# Patient Record
Sex: Female | Born: 1945
Health system: Southern US, Community
[De-identification: ages and names within clinical notes are randomized; demographics above are authoritative.]

---

## 2019-04-18 ENCOUNTER — Inpatient Hospital Stay (HOSPITAL_COMMUNITY): Payer: Medicare Other

## 2019-04-18 ENCOUNTER — Inpatient Hospital Stay (HOSPITAL_COMMUNITY)
Admission: AD | Admit: 2019-04-18 | Discharge: 2019-05-06 | DRG: 870 | Disposition: A | Discharge status: Hospice | Payer: Medicare Other | Source: Other Acute Inpatient Hospital | Attending: Internal Medicine | Admitting: Internal Medicine

## 2019-04-18 DIAGNOSIS — Z7189 Other specified counseling: Secondary | ICD-10-CM | POA: Diagnosis not present

## 2019-04-18 DIAGNOSIS — G9340 Encephalopathy, unspecified: Secondary | ICD-10-CM | POA: Diagnosis not present

## 2019-04-18 DIAGNOSIS — A4151 Sepsis due to Escherichia coli [E. coli]: Secondary | ICD-10-CM | POA: Diagnosis present

## 2019-04-18 DIAGNOSIS — R0689 Other abnormalities of breathing: Secondary | ICD-10-CM | POA: Diagnosis not present

## 2019-04-18 DIAGNOSIS — N184 Chronic kidney disease, stage 4 (severe): Secondary | ICD-10-CM | POA: Diagnosis present

## 2019-04-18 DIAGNOSIS — Z7984 Long term (current) use of oral hypoglycemic drugs: Secondary | ICD-10-CM

## 2019-04-18 DIAGNOSIS — Z978 Presence of other specified devices: Secondary | ICD-10-CM | POA: Diagnosis not present

## 2019-04-18 DIAGNOSIS — G92 Toxic encephalopathy: Secondary | ICD-10-CM | POA: Diagnosis present

## 2019-04-18 DIAGNOSIS — E785 Hyperlipidemia, unspecified: Secondary | ICD-10-CM | POA: Diagnosis present

## 2019-04-18 DIAGNOSIS — Z87442 Personal history of urinary calculi: Secondary | ICD-10-CM

## 2019-04-18 DIAGNOSIS — E1122 Type 2 diabetes mellitus with diabetic chronic kidney disease: Secondary | ICD-10-CM | POA: Diagnosis present

## 2019-04-18 DIAGNOSIS — A419 Sepsis, unspecified organism: Secondary | ICD-10-CM

## 2019-04-18 DIAGNOSIS — N179 Acute kidney failure, unspecified: Secondary | ICD-10-CM | POA: Diagnosis present

## 2019-04-18 DIAGNOSIS — F0390 Unspecified dementia without behavioral disturbance: Secondary | ICD-10-CM | POA: Diagnosis present

## 2019-04-18 DIAGNOSIS — E870 Hyperosmolality and hypernatremia: Secondary | ICD-10-CM | POA: Diagnosis present

## 2019-04-18 DIAGNOSIS — Z6834 Body mass index (BMI) 34.0-34.9, adult: Secondary | ICD-10-CM

## 2019-04-18 DIAGNOSIS — R918 Other nonspecific abnormal finding of lung field: Secondary | ICD-10-CM | POA: Diagnosis not present

## 2019-04-18 DIAGNOSIS — Z1621 Resistance to vancomycin: Secondary | ICD-10-CM | POA: Diagnosis present

## 2019-04-18 DIAGNOSIS — E039 Hypothyroidism, unspecified: Secondary | ICD-10-CM | POA: Diagnosis present

## 2019-04-18 DIAGNOSIS — J9601 Acute respiratory failure with hypoxia: Secondary | ICD-10-CM | POA: Diagnosis present

## 2019-04-18 DIAGNOSIS — F419 Anxiety disorder, unspecified: Secondary | ICD-10-CM | POA: Diagnosis present

## 2019-04-18 DIAGNOSIS — Z515 Encounter for palliative care: Secondary | ICD-10-CM

## 2019-04-18 DIAGNOSIS — Z01818 Encounter for other preprocedural examination: Secondary | ICD-10-CM | POA: Diagnosis not present

## 2019-04-18 DIAGNOSIS — E11649 Type 2 diabetes mellitus with hypoglycemia without coma: Secondary | ICD-10-CM | POA: Diagnosis present

## 2019-04-18 DIAGNOSIS — Z781 Physical restraint status: Secondary | ICD-10-CM

## 2019-04-18 DIAGNOSIS — N390 Urinary tract infection, site not specified: Secondary | ICD-10-CM | POA: Diagnosis present

## 2019-04-18 DIAGNOSIS — M109 Gout, unspecified: Secondary | ICD-10-CM | POA: Diagnosis present

## 2019-04-18 DIAGNOSIS — K219 Gastro-esophageal reflux disease without esophagitis: Secondary | ICD-10-CM | POA: Diagnosis present

## 2019-04-18 DIAGNOSIS — E1140 Type 2 diabetes mellitus with diabetic neuropathy, unspecified: Secondary | ICD-10-CM | POA: Diagnosis present

## 2019-04-18 DIAGNOSIS — D631 Anemia in chronic kidney disease: Secondary | ICD-10-CM | POA: Diagnosis present

## 2019-04-18 DIAGNOSIS — M1990 Unspecified osteoarthritis, unspecified site: Secondary | ICD-10-CM | POA: Diagnosis present

## 2019-04-18 DIAGNOSIS — I5032 Chronic diastolic (congestive) heart failure: Secondary | ICD-10-CM | POA: Diagnosis present

## 2019-04-18 DIAGNOSIS — Z8673 Personal history of transient ischemic attack (TIA), and cerebral infarction without residual deficits: Secondary | ICD-10-CM

## 2019-04-18 DIAGNOSIS — I130 Hypertensive heart and chronic kidney disease with heart failure and stage 1 through stage 4 chronic kidney disease, or unspecified chronic kidney disease: Secondary | ICD-10-CM | POA: Diagnosis present

## 2019-04-18 DIAGNOSIS — B9620 Unspecified Escherichia coli [E. coli] as the cause of diseases classified elsewhere: Secondary | ICD-10-CM | POA: Diagnosis not present

## 2019-04-18 DIAGNOSIS — E7800 Pure hypercholesterolemia, unspecified: Secondary | ICD-10-CM | POA: Diagnosis present

## 2019-04-18 DIAGNOSIS — E1151 Type 2 diabetes mellitus with diabetic peripheral angiopathy without gangrene: Secondary | ICD-10-CM | POA: Diagnosis present

## 2019-04-18 DIAGNOSIS — R6521 Severe sepsis with septic shock: Secondary | ICD-10-CM | POA: Diagnosis present

## 2019-04-18 DIAGNOSIS — Z66 Do not resuscitate: Secondary | ICD-10-CM | POA: Diagnosis present

## 2019-04-18 DIAGNOSIS — J9600 Acute respiratory failure, unspecified whether with hypoxia or hypercapnia: Secondary | ICD-10-CM

## 2019-04-18 DIAGNOSIS — J9690 Respiratory failure, unspecified, unspecified whether with hypoxia or hypercapnia: Secondary | ICD-10-CM

## 2019-04-18 DIAGNOSIS — N170 Acute kidney failure with tubular necrosis: Secondary | ICD-10-CM | POA: Diagnosis not present

## 2019-04-18 DIAGNOSIS — Z9289 Personal history of other medical treatment: Secondary | ICD-10-CM

## 2019-04-18 DIAGNOSIS — Z20828 Contact with and (suspected) exposure to other viral communicable diseases: Secondary | ICD-10-CM | POA: Diagnosis present

## 2019-04-18 DIAGNOSIS — R310 Gross hematuria: Secondary | ICD-10-CM | POA: Diagnosis not present

## 2019-04-18 DIAGNOSIS — D696 Thrombocytopenia, unspecified: Secondary | ICD-10-CM | POA: Diagnosis present

## 2019-04-18 DIAGNOSIS — B9629 Other Escherichia coli [E. coli] as the cause of diseases classified elsewhere: Secondary | ICD-10-CM | POA: Diagnosis not present

## 2019-04-18 DIAGNOSIS — Z1612 Extended spectrum beta lactamase (ESBL) resistance: Secondary | ICD-10-CM | POA: Diagnosis present

## 2019-04-18 DIAGNOSIS — E1165 Type 2 diabetes mellitus with hyperglycemia: Secondary | ICD-10-CM | POA: Diagnosis not present

## 2019-04-18 DIAGNOSIS — Z789 Other specified health status: Secondary | ICD-10-CM

## 2019-04-18 DIAGNOSIS — R7881 Bacteremia: Secondary | ICD-10-CM | POA: Diagnosis not present

## 2019-04-18 DIAGNOSIS — E669 Obesity, unspecified: Secondary | ICD-10-CM | POA: Diagnosis present

## 2019-04-18 DIAGNOSIS — Z993 Dependence on wheelchair: Secondary | ICD-10-CM

## 2019-04-18 DIAGNOSIS — R52 Pain, unspecified: Secondary | ICD-10-CM

## 2019-04-18 DIAGNOSIS — E875 Hyperkalemia: Secondary | ICD-10-CM | POA: Diagnosis not present

## 2019-04-18 DIAGNOSIS — T829XXA Unspecified complication of cardiac and vascular prosthetic device, implant and graft, initial encounter: Secondary | ICD-10-CM

## 2019-04-18 LAB — GLUCOSE, CAPILLARY
Glucose-Capillary: 120 mg/dL — ABNORMAL HIGH (ref 70–99)
Glucose-Capillary: 66 mg/dL — ABNORMAL LOW (ref 70–99)

## 2019-04-18 LAB — MRSA PCR SCREENING: MRSA by PCR: POSITIVE — AB

## 2019-04-18 MED ORDER — DEXTROSE 50 % IV SOLN
12.5000 g | INTRAVENOUS | Status: AC
  Administered 2019-04-18: 21:00:00 12.5 g via INTRAVENOUS

## 2019-04-18 MED ORDER — DEXTROSE 50 % IV SOLN
INTRAVENOUS | Status: AC
  Administered 2019-04-18: 12.5 g via INTRAVENOUS
  Filled 2019-04-18: qty 50

## 2019-04-18 MED ORDER — SODIUM CHLORIDE 0.9 % IV BOLUS
500.0000 mL | Freq: Once | INTRAVENOUS | Status: AC
  Administered 2019-04-18: 500 mL via INTRAVENOUS

## 2019-04-18 MED ORDER — NEPRO/CARBSTEADY PO LIQD
1000.0000 mL | ORAL | Status: DC
  Administered 2019-04-19: 1000 mL
  Filled 2019-04-18: qty 1000

## 2019-04-18 MED ORDER — NOREPINEPHRINE 4 MG/250ML-% IV SOLN
0.0000 ug/min | INTRAVENOUS | Status: DC
  Filled 2019-04-18: qty 250

## 2019-04-18 MED ORDER — NOREPINEPHRINE 4 MG/250ML-% IV SOLN: 5.0000 ug/min | INTRAVENOUS | Status: DC

## 2019-04-18 MED ORDER — GABAPENTIN 300 MG/6ML PO SOLN
300.0000 mg | Freq: Three times a day (TID) | ORAL | Status: DC
  Administered 2019-04-19 (×2): 300 mg
  Filled 2019-04-18 (×3): qty 6

## 2019-04-18 MED ORDER — PANTOPRAZOLE SODIUM 40 MG IV SOLR
40.0000 mg | Freq: Every day | INTRAVENOUS | Status: DC
  Administered 2019-04-19 – 2019-04-21 (×4): 40 mg via INTRAVENOUS
  Filled 2019-04-18 (×4): qty 40

## 2019-04-18 MED ORDER — ALBUMIN HUMAN 5 % IV SOLN
25.0000 g | Freq: Once | INTRAVENOUS | Status: AC
  Administered 2019-04-18: 25 g via INTRAVENOUS
  Filled 2019-04-18: qty 250

## 2019-04-18 MED ORDER — HEPARIN SODIUM (PORCINE) 5000 UNIT/ML IJ SOLN
5000.0000 [IU] | Freq: Three times a day (TID) | INTRAMUSCULAR | Status: DC
  Administered 2019-04-19 – 2019-04-25 (×20): 5000 [IU] via SUBCUTANEOUS
  Filled 2019-04-18 (×21): qty 1

## 2019-04-18 MED ORDER — LACTATED RINGERS IV BOLUS: 2000.0000 mL | Freq: Once | INTRAVENOUS | Status: DC

## 2019-04-18 MED ORDER — SODIUM CHLORIDE 0.9 % IV SOLN
250.0000 mL | INTRAVENOUS | Status: DC
  Administered 2019-04-19: 02:00:00 250 mL via INTRAVENOUS

## 2019-04-18 MED ORDER — CHLORHEXIDINE GLUCONATE CLOTH 2 % EX PADS
6.0000 | MEDICATED_PAD | Freq: Every day | CUTANEOUS | Status: DC
  Administered 2019-04-18 – 2019-05-01 (×13): 6 via TOPICAL

## 2019-04-18 MED ORDER — GABAPENTIN 600 MG PO TABS
300.0000 mg | ORAL_TABLET | Freq: Three times a day (TID) | ORAL | Status: DC
  Filled 2019-04-18: qty 0.5

## 2019-04-18 NOTE — H&P (Addendum)
..   NAME:  Nancy Mullins, MRN:  [id number], DOB:  03/12/1946, LOS: 0 ADMISSION DATE:  04/18/2019, CONSULTATION DATE:  04/18/2019 REFERRING MD:  Eugene Garnet MD, CHIEF COMPLAINT:  AMS   Brief History   72 yo F w/ PMHX NIDDM, Hypothyroid, CKD, CHF, GERD & HDL presents from [place]. In Septic Shock intubated.   History of present illness   ( History obtained from chart from [facility]. Pt encephalopathic and intubated. No other documentation in EMR)  73 yr old female presented on 7/14 from [facility] 2[facility]3 [facility]) where she had a sudden onset of altered mental status that began in the AM.  Per EMS the pt's baseline is AAO x 4. This morning she was more lethargic and somnolent. On initial assessment at [facility] ED the pt was alert and oriented only to self but lethargic. She was protecting her airway and was in no respiratory distress at that time moving all 4 extremities. She was unable to follow commands. Per documentation pt started to decline post arrival and was intubated for airway protection.  After intubation at 16:33 she became hypotensive  PCCM at [facility] accepted pt transfer.at 17:24  Per Daughter : she called to check on her Mom in the morning 7/14. She was told by the NH staff that Mom was lethargic with decreased responsiveness. She states that the patient has had similar episodes like this in the past, always with hypotension and hypoglycemia. It takes her several days to return to baseline . This is the first time she has required intubation. Her baseline per her daughter is  Alert and oriented to person and place. She does have a h/o dementia.   Past Medical History  Per chart from [place]  HTN HLD ARTHRITIS GERD GOUT DEMENTIA NEPHROLITHIASIS  CKD CHF NIDDM w/ Neuropathy HYPERKALEMIA HYPOTHYROID PVD NEPHROLITHIASIS   Patient had a urine cx from 6/11 that on 6/13 resulted as Ecoli/ESBL  .>>verified by [facility]   Transferred from [facility] via [place]:  None currently  Procedures:  7/14 1633>>>>Endotracheally intubated at [place] Diagnostic Tests:  ABG: 7.29/45.4/95.9/21.5 WBC 7.88/ Hgb 7.8 Hct 25.7 MCV 91 Plts 236 LACTIC ACID 1 PT 18.7 INR 1.7 Trop <0.015  UA: Yellow turbid urine Spec grav 1.011 ph 5.5 protein 1+ Blood +3 Large Leukocytes negative Nitrites WBC>50 RBC >50 Bacteria 3+ UDS: negative 7/14 15:45>>>SARSCOV2 NEGATIVE POC GLUCOSE 62  Acetaminophen <1 Salicylate <2 Na 924, K+ 3.8 Cl- 115 HCO3- 23 AG 8 Gluc 113 Calcium 8.1 Albumin 2.2 Tot Protein 5.8 Bilirubin Tot 0.2 BUN 31 Cr 2.98 GFR 19 AST 12 ALT 15 ALK PHOS 81 CKMB 5.2 TSH 1.79 Ammonia 18 Valproic Acid level 77   CTH w/o contrast at [facility]: no acute intracranial process Chronic involution changes ( disc on chart)  CXR at [facility]: hypoinflated lungs w/ vascular congestion/edema. ( disc on chart)   Micro Data:  04/18/2019>>>>Blood cx x 2 04/18/2019>>>>Urine cx  Antimicrobials:  04/18/2019>>>Meropenem   04/18/2019>>> Ceftriaxone   Interim history/subjective:  Initial Vitals on presentation: 132/68mHg HR 70 O2 sat 100% Became hypotensive 2 hrs post arrival  [facility] of D50 with Meropenem and Ceftriaxone   Objective   Blood pressure (!) 61/50, pulse 73, temperature (!) 95.2 F (35.1 C), temperature source Bladder, resp. rate 14, weight 80 kg, SpO2 100 %.    Vent Mode: PRVC  FiO2 (%):  [30 %] 30 % Set Rate:  [14 bmp] 14 bmp Vt Set:  [350 mL] 350 mL PEEP:  [5 [place] Pressure:  [14 cmH20] 14 cmH20   Intake/Output Summary (Last 24 hours) at 04/18/2019 2221 Last data filed at 04/18/2019 2100 Gross per 24 hour  Intake -  Output 150 ml  Net -150 ml   Filed Weights   04/18/19 2100  Weight: 80 kg    Examination: General: somnolent intubated HENT: normocephalic ETT in  oropharynx Lungs: coarse bilaterally w/ crackles at bases Cardiovascular: S1 and S2 appreciated reg rate and rhythm Abdomen: soft non tender non distended + BS Extremities: no signs of atrophy.  Neuro: somnolent, withdraws from painful stimuli, coughs intermittently.  GU: temp foley placed at [facility] Skin: small tear under left breast. Heels cracked   Assessment & Plan:  1. Acute Encephalopathy Progressive mental decline starting AM 4/53 No clear metabolic or toxic etiology May be secondary to sepsis from UTI Pt was never hypoxic per chart review CTH w/o contrast was negative ? H/o seizures not able to verify but pt had a Valproic acid level 77 within therapeutic range [facility] >>sending records Plan: Hold all sedative medications Continue on Neuro checks If pt does not improve may warrant EEG  and further workup by Neuro  2. Acute Respiratory Insufficiency Secondary to patient's declining mental status concern for protection of airway Endotracheally intubated at [place]: TV 8 cc/kg  Titrate FiO2 to keep O2 sat >92% VA Precautions Continue full MV support until mental status improves SBT when pt meets parameters  3. Septic Shock vs Hypovolemic shock Source: suspected UTI>>has a h/o ESBL from cx on 03/16/2019 Has a h/o CHF No signs of bleeding Pt became hypotensive shortly after intubation Plan: CVL placement>>>CVP monitoring If <8 will fluid resuscitate ( pt received 3L NS at [facility]) MAP goal >38mHg Arterial line to be placed Will get a 2D ECHO to assess LVEF Continue on Levophed gtt titrate to MAP goal Check cortisol level>> may benefit from stress dose steroids F/u blood and urine cultures. F/u PCT Continue on contact precautions given ESBL h/o Continue with Meropenem>> renally dosed given GFR 19 Pharmacy notified of previous antibiotics>> [facility] Deescalate abx when possible  4. CKD stage IV GFR  19 [place] Avoid nephrotoxic medications Strict Is/Os  5. Anemia of Chronic disease H/o CKD and multiple co morbidities Plan: F/u Hgb If Hgb <7 transfuse PRBCs Start on folic acid F/u iron studies  6. H/o NIDDM Hypoglycemic on arrival Plan: Hold ISS at this time Starting on early TF with NEPRO due to elevated Creatinine   Best practice:  Diet: NPO Pain/Anxiety/Delirium protocol (if indicated): not at this time VAP protocol (if indicated): yes DVT prophylaxis: Hep [place] and SCDs GI prophylaxis: Protonix Glucose control: on hold due to hypoglycemia Mobility: bedrest Code Status: Full  Family Communication: Daughter's HSuezanne Cheshirephone number [phone]: ICU  Labs   CBC: No results for input(s): WBC, NEUTROABS, HGB, HCT, MCV, PLT in the last 168 hours.  Basic Metabolic Panel: No results for input(s): NA, K, CL, CO2, GLUCOSE, BUN, CREATININE, CALCIUM, MG, PHOS in the last 168 hours. GFR: CrCl cannot be calculated (No successful lab value found.). No results for input(s): PROCALCITON, WBC, LATICACIDVEN in the last 168 hours.  Liver Function Tests: No results for input(s): AST, ALT, ALKPHOS, BILITOT, PROT, ALBUMIN in the last  168 hours. No results for input(s): LIPASE, AMYLASE in the last 168 hours. No results for input(s): AMMONIA in the last 168 hours.  ABG No results found for: PHART, PCO2ART, PO2ART, HCO3, TCO2, ACIDBASEDEF, O2SAT   Coagulation Profile: No results for input(s): INR, PROTIME in the last 168 hours.  Cardiac Enzymes: No results for input(s): CKTOTAL, CKMB, CKMBINDEX, TROPONINI in the last 168 hours.  HbA1C: No results found for: HGBA1C  CBG: Recent Labs  Lab 04/18/19 2058 04/18/19 2118  GLUCAP 66* 120*    Review of Systems:   Marland KitchenMarland KitchenReview of Systems  Unable to perform ROS: Mental status change   AND INTUBATED.  Past Medical History  She,  has no past medical history on file.   Surgical  History   NONE IN EMR and NOT AVAILABLE on CHART SENT   Social History      Family History   Her family history is not on file.   Allergies Allergies  Allergen Reactions  . Benzonatate   . Codeine   . Lithium      Home Medications  Prior to Admission medications   Not on File    STAFF NOTE  I, Dr Seward Carol have personally reviewed patient's available data, including medical history, events of note, physical examination and test results as part of my evaluation. I have discussed with NP and other care providers such as pharmacist, RN and Elink.  In addition,  I personally evaluated patient  The patient is critically ill with multiple organ systems failure and requires high complexity decision making for assessment and support, frequent evaluation and titration of therapies, application of advanced monitoring technologies and extensive interpretation of multiple databases.   Critical Care Time devoted to patient care services described in this note is 4 Minutes. This time reflects time of care of this signee Dr Seward Carol. This critical care time does not reflect procedure time, or teaching time or supervisory time but could involve care discussion time    Dr. Seward Carol Pulmonary Critical Care Medicine  04/18/2019 11:26 PM   Critical care time: 55 mins

## 2019-04-18 NOTE — Procedures (Signed)
Central Venous Catheter Insertion Procedure Note Nancy Mullins [id number] Sep 16, 1946  Procedure: Insertion of Central Venous Catheter Indications: Assessment of intravascular volume, Drug and/or fluid administration and Frequent blood sampling  Procedure Details Consent: Unable to obtain consent because of altered level of consciousness. Time Out: Verified patient identification, verified procedure, site/side was marked, verified correct patient position, special equipment/implants available, medications/allergies/relevent history reviewed, required imaging and test results available.  Performed  Maximum sterile technique was used including antiseptics, cap, gloves, gown, hand hygiene, mask and sheet. Skin prep: Chlorhexidine; local anesthetic administered A antimicrobial bonded/coated triple lumen catheter was placed in the left internal jugular vein using the Seldinger technique.  Evaluation Blood flow good Complications: No apparent complications Patient did tolerate procedure well. Chest X-ray ordered to verify placement.  CXR: pending.  Procedure performed under direct ultrasound guidance for real time vessel cannulation.      Montey Hora, [place] [facility] Pager: [phone]  or [phone] 04/18/2019, 10:44 PM

## 2019-04-18 NOTE — Progress Notes (Signed)
eLink Physician-Brief Progress Note Patient Name: Nancy Mullins DOB: Apr 06, 1946 MRN: [id number]   Date of Service  04/18/2019  HPI/Events of Note  Pt just admitted to 3 M 12 from outside hospital, there are no records in the chart in [place]. Pt is hypotensive with BP of 60/51 despite receiving 3 liters of iv fluids.  eICU Interventions  I've asked the ground crew to see stat, 5 % Albumin 500 ml iv bolus x 1 stat, Normal Saline 500 ml iv bolus x 1 stat, Norepinephrine infusion ordered stat, arterial line insertion ordered stat.        Kerry Kass Vantasia Pinkney 04/18/2019, 10:02 PM

## 2019-04-18 NOTE — Progress Notes (Signed)
Placed Arterial Line, collected ABG.  Patient's pH 7.330, PaCO2 35.5, PaO2 95, HCO3 19.  The Hbg showed low on ABG at 6.5, Anderson Malta, RN and Dr. Andria Meuse were both notified.

## 2019-04-18 NOTE — Progress Notes (Signed)
Hypoglycemic Event  CBG: 66  Treatment: D50 25 mL (12.5 gm)  Symptoms: None  Follow-up CBG: Time: 2119 CBG Result: 120  Possible Reasons for Event: Unknown  Comments/MD notified: MD to be notified    Nancy Mullins, Garnette Scheuermann

## 2019-04-18 NOTE — Progress Notes (Signed)
Patient arrived to 3M12.  Placed patient on vent to the settings given by [place] crew.  They stated that when they took over, the patient was not tolerating the prior vent settings well, so they had her on Vt of 350, Rate of 14 at 40% with a PEEP of 5.  I have placed patient on these settings at this time, patient is tolerating well.  Will continue to monitor.

## 2019-04-19 ENCOUNTER — Inpatient Hospital Stay (HOSPITAL_COMMUNITY): Payer: Medicare Other

## 2019-04-19 ENCOUNTER — Other Ambulatory Visit: Payer: Self-pay

## 2019-04-19 DIAGNOSIS — A419 Sepsis, unspecified organism: Secondary | ICD-10-CM

## 2019-04-19 DIAGNOSIS — G92 Toxic encephalopathy: Secondary | ICD-10-CM

## 2019-04-19 LAB — CBC WITH DIFFERENTIAL/PLATELET
Abs Immature Granulocytes: 0.29 10*3/uL — ABNORMAL HIGH (ref 0.00–0.07)
Basophils Absolute: 0 10*3/uL (ref 0.0–0.1)
Basophils Relative: 0 %
Eosinophils Absolute: 0 10*3/uL (ref 0.0–0.5)
Eosinophils Relative: 0 %
HCT: 21.9 % — ABNORMAL LOW (ref 36.0–46.0)
Hemoglobin: 6.9 g/dL — CL (ref 12.0–15.0)
Immature Granulocytes: 4 %
Lymphocytes Relative: 10 %
Lymphs Abs: 0.7 10*3/uL (ref 0.7–4.0)
MCH: 28.3 pg (ref 26.0–34.0)
MCHC: 31.5 g/dL (ref 30.0–36.0)
MCV: 89.8 fL (ref 80.0–100.0)
Monocytes Absolute: 0.4 10*3/uL (ref 0.1–1.0)
Monocytes Relative: 6 %
Neutro Abs: 5.3 10*3/uL (ref 1.7–7.7)
Neutrophils Relative %: 80 %
Platelets: 208 10*3/uL (ref 150–400)
RBC: 2.44 MIL/uL — ABNORMAL LOW (ref 3.87–5.11)
RDW: 18.1 % — ABNORMAL HIGH (ref 11.5–15.5)
WBC: 6.6 10*3/uL (ref 4.0–10.5)
nRBC: 1.5 % — ABNORMAL HIGH (ref 0.0–0.2)

## 2019-04-19 LAB — POCT I-STAT 7, (LYTES, BLD GAS, ICA,H+H)
Acid-base deficit: 7 mmol/L — ABNORMAL HIGH (ref 0.0–2.0)
Bicarbonate: 19 mmol/L — ABNORMAL LOW (ref 20.0–28.0)
Calcium, Ion: 1.24 mmol/L (ref 1.15–1.40)
HCT: 19 % — ABNORMAL LOW (ref 36.0–46.0)
Hemoglobin: 6.5 g/dL — CL (ref 12.0–15.0)
O2 Saturation: 97 %
Patient temperature: 35.8
Potassium: 3.5 mmol/L (ref 3.5–5.1)
Sodium: 146 mmol/L — ABNORMAL HIGH (ref 135–145)
TCO2: 20 mmol/L — ABNORMAL LOW (ref 22–32)
pCO2 arterial: 35.5 mmHg (ref 32.0–48.0)
pH, Arterial: 7.33 — ABNORMAL LOW (ref 7.350–7.450)
pO2, Arterial: 95 mmHg (ref 83.0–108.0)

## 2019-04-19 LAB — COMPREHENSIVE METABOLIC PANEL
ALT: 10 U/L (ref 0–44)
AST: 10 U/L — ABNORMAL LOW (ref 15–41)
Albumin: 2 g/dL — ABNORMAL LOW (ref 3.5–5.0)
Alkaline Phosphatase: 60 U/L (ref 38–126)
Anion gap: 8 (ref 5–15)
BUN: 27 mg/dL — ABNORMAL HIGH (ref 8–23)
CO2: 20 mmol/L — ABNORMAL LOW (ref 22–32)
Calcium: 7.8 mg/dL — ABNORMAL LOW (ref 8.9–10.3)
Chloride: 118 mmol/L — ABNORMAL HIGH (ref 98–111)
Creatinine, Ser: 3.09 mg/dL — ABNORMAL HIGH (ref 0.44–1.00)
GFR calc Af Amer: 17 mL/min — ABNORMAL LOW (ref >60)
GFR calc non Af Amer: 14 mL/min — ABNORMAL LOW (ref >60)
Glucose, Bld: 76 mg/dL (ref 70–99)
Potassium: 3.9 mmol/L (ref 3.5–5.1)
Sodium: 146 mmol/L — ABNORMAL HIGH (ref 135–145)
Total Bilirubin: 0.5 mg/dL (ref 0.3–1.2)
Total Protein: 5.1 g/dL — ABNORMAL LOW (ref 6.5–8.1)

## 2019-04-19 LAB — CBC
HCT: 27.3 % — ABNORMAL LOW (ref 36.0–46.0)
Hemoglobin: 8.5 g/dL — ABNORMAL LOW (ref 12.0–15.0)
MCH: 27.8 pg (ref 26.0–34.0)
MCHC: 31.1 g/dL (ref 30.0–36.0)
MCV: 89.2 fL (ref 80.0–100.0)
Platelets: 187 10*3/uL (ref 150–400)
RBC: 3.06 MIL/uL — ABNORMAL LOW (ref 3.87–5.11)
RDW: 18 % — ABNORMAL HIGH (ref 11.5–15.5)
WBC: 7.1 10*3/uL (ref 4.0–10.5)
nRBC: 1.7 % — ABNORMAL HIGH (ref 0.0–0.2)

## 2019-04-19 LAB — TROPONIN I (HIGH SENSITIVITY)
Troponin I (High Sensitivity): 9 ng/L (ref <18)
Troponin I (High Sensitivity): 9 ng/L (ref <18)

## 2019-04-19 LAB — GLUCOSE, CAPILLARY
Glucose-Capillary: 100 mg/dL — ABNORMAL HIGH (ref 70–99)
Glucose-Capillary: 126 mg/dL — ABNORMAL HIGH (ref 70–99)
Glucose-Capillary: 70 mg/dL (ref 70–99)
Glucose-Capillary: 77 mg/dL (ref 70–99)
Glucose-Capillary: 80 mg/dL (ref 70–99)
Glucose-Capillary: 84 mg/dL (ref 70–99)
Glucose-Capillary: 86 mg/dL (ref 70–99)

## 2019-04-19 LAB — CREATININE, SERUM
Creatinine, Ser: 2.92 mg/dL — ABNORMAL HIGH (ref 0.44–1.00)
GFR calc Af Amer: 18 mL/min — ABNORMAL LOW (ref >60)
GFR calc non Af Amer: 16 mL/min — ABNORMAL LOW (ref >60)

## 2019-04-19 LAB — FOLATE: Folate: 7.1 ng/mL (ref >5.9)

## 2019-04-19 LAB — AMMONIA: Ammonia: 29 umol/L (ref 9–35)

## 2019-04-19 LAB — CK: Total CK: 18 U/L — ABNORMAL LOW (ref 38–234)

## 2019-04-19 LAB — ECHOCARDIOGRAM COMPLETE
Height: 63 in
Weight: 2821.89 oz

## 2019-04-19 LAB — MAGNESIUM: Magnesium: 1.5 mg/dL — ABNORMAL LOW (ref 1.7–2.4)

## 2019-04-19 LAB — PROTIME-INR
INR: 1.5 — ABNORMAL HIGH (ref 0.8–1.2)
Prothrombin Time: 18.3 seconds — ABNORMAL HIGH (ref 11.4–15.2)

## 2019-04-19 LAB — PROCALCITONIN
Procalcitonin: 0.63 ng/mL
Procalcitonin: 0.64 ng/mL

## 2019-04-19 LAB — TSH: TSH: 2.414 u[IU]/mL (ref 0.350–4.500)

## 2019-04-19 LAB — CORTISOL: Cortisol, Plasma: 18.4 ug/dL

## 2019-04-19 LAB — ABO/RH: ABO/RH(D): A POS

## 2019-04-19 LAB — PREPARE RBC (CROSSMATCH)

## 2019-04-19 MED ORDER — CHLORHEXIDINE GLUCONATE 0.12% ORAL RINSE (MEDLINE KIT)
15.0000 mL | Freq: Two times a day (BID) | OROMUCOSAL | Status: DC
  Administered 2019-04-19 – 2019-05-02 (×26): 15 mL via OROMUCOSAL

## 2019-04-19 MED ORDER — FOLIC ACID 1 MG PO TABS
1.0000 mg | ORAL_TABLET | Freq: Every day | ORAL | Status: AC
  Administered 2019-04-19 – 2019-04-24 (×7): 1 mg via ORAL
  Filled 2019-04-19 (×7): qty 1

## 2019-04-19 MED ORDER — GABAPENTIN 300 MG/6ML PO SOLN
300.0000 mg | Freq: Two times a day (BID) | ORAL | Status: DC
  Administered 2019-04-19 – 2019-04-24 (×10): 300 mg
  Filled 2019-04-19 (×10): qty 6

## 2019-04-19 MED ORDER — PRO-STAT SUGAR FREE PO LIQD
30.0000 mL | Freq: Every day | ORAL | Status: DC
  Administered 2019-04-19 – 2019-05-02 (×14): 30 mL
  Filled 2019-04-19 (×14): qty 30

## 2019-04-19 MED ORDER — VITAL AF 1.2 CAL PO LIQD
1000.0000 mL | ORAL | Status: DC
  Administered 2019-04-19 – 2019-05-01 (×13): 1000 mL
  Filled 2019-04-19 (×4): qty 1000

## 2019-04-19 MED ORDER — SODIUM CHLORIDE 0.9 % IV SOLN
1.0000 g | Freq: Two times a day (BID) | INTRAVENOUS | Status: DC
  Administered 2019-04-19 – 2019-04-24 (×11): 1 g via INTRAVENOUS
  Filled 2019-04-19 (×12): qty 1

## 2019-04-19 MED ORDER — MUPIROCIN 2 % EX OINT
1.0000 "application " | TOPICAL_OINTMENT | Freq: Two times a day (BID) | CUTANEOUS | Status: AC
  Administered 2019-04-19 – 2019-04-23 (×10): 1 via NASAL
  Filled 2019-04-19: qty 22

## 2019-04-19 MED ORDER — MAGNESIUM SULFATE IN D5W 1-5 GM/100ML-% IV SOLN
1.0000 g | Freq: Once | INTRAVENOUS | Status: AC
  Administered 2019-04-19: 1 g via INTRAVENOUS
  Filled 2019-04-19: qty 100

## 2019-04-19 MED ORDER — SODIUM CHLORIDE 0.9% IV SOLUTION
Freq: Once | INTRAVENOUS | Status: AC
  Administered 2019-04-19: 01:00:00 via INTRAVENOUS

## 2019-04-19 MED ORDER — LACTATED RINGERS IV BOLUS
500.0000 mL | Freq: Once | INTRAVENOUS | Status: AC
  Administered 2019-04-19: 500 mL via INTRAVENOUS

## 2019-04-19 MED ORDER — ORAL CARE MOUTH RINSE
15.0000 mL | OROMUCOSAL | Status: DC
  Administered 2019-04-19 – 2019-05-02 (×125): 15 mL via OROMUCOSAL

## 2019-04-19 NOTE — Progress Notes (Signed)
  Echocardiogram 2D Echocardiogram has been performed.  Nancy Mullins 04/19/2019, 11:53 AM

## 2019-04-19 NOTE — Progress Notes (Signed)
EEG completed, results pending. 

## 2019-04-19 NOTE — Procedures (Signed)
ELECTROENCEPHALOGRAM REPORT   Patient: Nancy Mullins       Room #: 3M12C EEG No. ID: [id number] Age: 73 y.o.        Sex: female Referring Physician: Nelda Marseille Report Date:  04/19/2019        Interpreting Physician: Alexis Goodell  History: Nancy Mullins is an 73 y.o. female with altered mental status  Medications:  Neurontin, Merrem, Protonix  Conditions of Recording:  This is a 21 channel routine scalp EEG performed with bipolar and monopolar montages arranged in accordance to the international 10/20 system of electrode placement. One channel was dedicated to EKG recording.  The patient is in the intubated state.  Description:  The background activity is slow and asymmetric.  The left hemisphere is dominated mostly with theta activity.  Some delta activity is noted as well.  The maximum posterior background rhythm noted is 6Hz .  Delta activity dominates the right hemisphere where the slowing is more predominant than that noted over the left.  Amplitude is decreased as well with this decrease in amplitude being most dramatic in the right temporal region.  In addition to this amplitude and frequency asymmetry there is noted frequent intermittent discharges of triphasic morphology that are more prominent on the left hemisphere.  Hyperventilation and intermittent photic stimulation were not performed.  IMPRESSION: This is an abnormal electroencephalogram: 1.  There is overall slowing with this slowing being more prominent with decreased amplitude over the right hemisphere (temproal region most notably) 2. Intermittent triphasic waves most notable over the left hemisphere.  Findings consistent with a diffuse cerebral disturbance that is etiologically nonspecific, but may include a metabolic encephalopathy, among other possibilities.  Additionally there is suggestion of a focal disturbance, etiologically nonspecific, over the right hemisphere.      Alexis Goodell,  MD Neurology [phone] 04/19/2019, 11:26 AM

## 2019-04-19 NOTE — Progress Notes (Signed)
Received call from OSH stating one blood culture bottle was postive for gram negative bacilli. I reported this to Dr. Vaughan Browner at this time. No further orders received.   Myrle Sheng, BSN, RN 59M/81M MICU

## 2019-04-19 NOTE — Progress Notes (Signed)
Pharmacy Antibiotic Note  Nancy Mullins is a 73 y.o. female admitted on 04/18/2019 with sepsis d/t suspected UTI w/ recent h/o ESBL E.coli.  Pharmacy has been consulted for Merrem dosing.  Labs from OSH: Na 147, K+ 3.8 Cl- 115 HCO3- 23 AG 8 Gluc 113 Calcium 8.1 Albumin 2.2 Tot Protein 5.8 Bilirubin Tot 0.2 BUN 31 Cr 2.98 GFR 19 AST 12 ALT 15 ALK PHOS 81 CKMB 5.2 TSH 1.79 Ammonia 18  Plan: Rec'd first dose at [facility]; continue Merrem 1g IV Q12H.  Weight: 176 lb 5.9 oz (80 kg)  Temp (24hrs), Avg:95.9 F (35.5 C), Min:95.2 F (35.1 C), Max:96.6 F (35.9 C)   Allergies  Allergen Reactions  . Benzonatate   . Codeine   . Lithium     Thank you for allowing pharmacy to be a part of this patient's care.  Wynona Neat, PharmD, BCPS  04/19/2019 12:29 AM

## 2019-04-19 NOTE — Consult Note (Addendum)
Neurology Consultation  Reason for Consult: Altered mental status Referring Physician: Nelda Marseille  History is obtained from: Chart  HPI: Nancy Mullins is a 73 y.o. female who presented on 7/14 from [facility] center [place] where she had a sudden onset of altered mental status that began in the a.m.  Per EMS, supposedly she is alert and oriented x4 at baseline but no family reachable over the phone to get any further information.  Post arrival to the hospital patient began to decline and was intubated for airway protection.  Per chart the daughter was told by the nursing staff the morning of 7/14 that her mom was very lethargic and decreased responsiveness.  Per daughter she is often had these episodes with hypotension and hypoglycemia, both of which were present on arrival.  She also takes several days to return back to baseline.  Attempted to call family but no response.  Primary contact--Thomas , Hannhil   [phone] Next of Kin--Helenka Ayars [phone]   ROS: Unable to obtain due to altered mental status.   Allergies: Benzonatate, Lithium, Codeine, Sulfate  past medical history: Dementia, Diabetes, Anemia, Hypercholesterolemia, HTN, Arthritis, Gout, CKD and CHF   No family history on file.  Unable to obtain as patient is intubated and altered mental status.  This has been requested.   Social History:  Unable to obtain as patient is intubated with altered mental status  Medications  Current Facility-Administered Medications:  .  0.9 %  sodium chloride infusion, 250 mL, Intravenous, Continuous, Scatliffe, Rise Paganini, MD, Stopped at 04/19/19 0436 .  chlorhexidine gluconate (MEDLINE KIT) (PERIDEX) 0.12 % solution 15 mL, 15 mL, Mouth Rinse, BID, Scatliffe, Kristen D, MD, 15 mL at 04/19/19 0845 .  Chlorhexidine Gluconate Cloth 2 % PADS 6 each, 6 each, Topical, Q0600, Rush Farmer, MD, 6 each at 04/18/19 2100 .  feeding supplement (NEPRO CARB STEADY) liquid 1,000 mL, 1,000 mL, Per  Tube, Q24H, Scatliffe, Kristen D, MD, Last Rate: 30 mL/hr at 04/19/19 0121, 1,000 mL at 04/19/19 0121 .  folic acid (FOLVITE) tablet 1 mg, 1 mg, Oral, Daily, Scatliffe, Kristen D, MD, 1 mg at 04/19/19 1056 .  gabapentin (NEURONTIN) 300 MG/6ML solution 300 mg, 300 mg, Per Tube, BID, Masters, Jake Church, RPH .  heparin injection 5,000 Units, 5,000 Units, Subcutaneous, Q8H, Scatliffe, Rise Paganini, MD, 5,000 Units at 04/19/19 0122 .  MEDLINE mouth rinse, 15 mL, Mouth Rinse, 10 times per day, Scatliffe, Kristen D, MD, 15 mL at 04/19/19 1221 .  meropenem (MERREM) 1 g in sodium chloride 0.9 % 100 mL IVPB, 1 g, Intravenous, Q12H, Laren Everts, RPH, Stopped at 04/19/19 0507 .  mupirocin ointment (BACTROBAN) 2 % 1 application, 1 application, Nasal, BID, Scatliffe, Rise Paganini, MD, 1 application at 27/74/12 1056 .  pantoprazole (PROTONIX) injection 40 mg, 40 mg, Intravenous, QHS, Scatliffe, Kristen D, MD, 40 mg at 04/19/19 0122   Exam: Current vital signs: BP (!) 115/49 (BP Location: Left Arm)   Pulse 83   Temp (!) 96.3 F (35.7 C) (Bladder)   Resp 15   Ht 5' 3" (1.6 m)   Wt 80 kg   SpO2 100%   BMI 31.24 kg/m  Vital signs in last 24 hours: Temp:  [95.2 F (35.1 C)-99.9 F (37.7 C)] 96.3 F (35.7 C) (07/15 1200) Pulse Rate:  [64-98] 83 (07/15 1200) Resp:  [14-18] 15 (07/15 1200) BP: (61-150)/(49-100) 115/49 (07/15 1135) SpO2:  [100 %] 100 % (07/15 1200) Arterial Line BP: (106-160)/(49-68) 154/64 (07/15 1200)  FiO2 (%):  [30 %] 30 % (07/15 1200) Weight:  [80 kg] 80 kg (07/15 0132)  Physical Exam  Constitutional: Appears well-developed and well-nourished.  Psych: Obtunded Eyes: No scleral injection HENT: No OP obstrucion Head: Normocephalic.  Cardiovascular: Normal rate and regular rhythm.  Respiratory: Effort normal, non-labored breathing GI: Soft.  No distension. There is no tenderness.  Skin: WDI  Neuro: Mental Status: Patient does not respond to verbal stimuli.  Localizes sternal  rub with left arm greater than right.  Does not follow commands.  No verbalizations noted.  Breathing over the ventilator  cranial Nerves: II: patient does not respond confrontation bilaterally, however she is holding her eyelids forcefully shut III,IV,VI: doll's response present bilaterally.  Eyes disconjugate pupils right 2 mm, left 2 mm,and reactive bilaterally V,VII: corneal reflex present bilaterally  VIII: patient does not respond to verbal stimuli IX,X: gag reflex present, Motor: To noxious stimuli moves all extremities.  Does localize to pain with the right arm and left arm, is noted left greater than right.  Withdraws from noxious stimuli bilateral legs Sensory: Grimaces and withdraws from noxious stimuli Deep Tendon Reflexes:  Minimal throughout Plantars: downgoing bilaterally Cerebellar: Unable to perform  Labs I have reviewed labs in epic and the results pertinent to this consultation are:   CBC    Component Value Date/Time   WBC 7.1 04/19/2019 0751   RBC 3.06 (L) 04/19/2019 0751   HGB 8.5 (L) 04/19/2019 0751   HCT 27.3 (L) 04/19/2019 0751   PLT 187 04/19/2019 0751   MCV 89.2 04/19/2019 0751   MCH 27.8 04/19/2019 0751   MCHC 31.1 04/19/2019 0751   RDW 18.0 (H) 04/19/2019 0751   LYMPHSABS 0.7 04/18/2019 2346   MONOABS 0.4 04/18/2019 2346   EOSABS 0.0 04/18/2019 2346   BASOSABS 0.0 04/18/2019 2346    CMP     Component Value Date/Time   NA 146 (H) 04/19/2019 0751   K 3.9 04/19/2019 0751   CL 118 (H) 04/19/2019 0751   CO2 20 (L) 04/19/2019 0751   GLUCOSE 76 04/19/2019 0751   BUN 27 (H) 04/19/2019 0751   CREATININE 3.09 (H) 04/19/2019 0751   CALCIUM 7.8 (L) 04/19/2019 0751   PROT 5.1 (L) 04/19/2019 0751   ALBUMIN 2.0 (L) 04/19/2019 0751   AST 10 (L) 04/19/2019 0751   ALT 10 04/19/2019 0751   ALKPHOS 60 04/19/2019 0751   BILITOT 0.5 04/19/2019 0751   GFRNONAA 14 (L) 04/19/2019 0751   GFRAA 17 (L) 04/19/2019 0751    Lipid Panel  No results found for:  CHOL, TRIG, HDL, CHOLHDL, VLDL, LDLCALC, LDLDIRECT   Imaging I have reviewed the images obtained:  EEG shows: 1.  There is overall slowing with this slowing being more prominent with decreased amplitude over the right hemisphere (temproal region most notably) 2. Intermittent triphasic waves most notable over the left hemisphere.  Findings consistent with a diffuse cerebral disturbance that is etiologically nonspecific, but may include a metabolic encephalopathy, among other possibilities.  Additionally there is suggestion of a focal disturbance, etiologically nonspecific, over the right hemisphere.   -Blood culture showed no growth  -CT head obtained at outside hospital showed "no acute intracranial process"  MRI examination of the brain-pending  Etta Quill PA-C [facility] [phone]  M-F  (9:00 am- 5:00 PM)  04/19/2019, 1:45 PM     Assessment:  73 year old female presented to hospital secondary to altered mental status of unknown etiology.  Currently labs have come back negative other than  elevated BUN/creatinine.  Blood cultures negative.  Urine culture pending.  EEG showed no epileptiform activity however there appears to be a focal disturbance which is nonspecific over the right hemisphere.  There is history that patient has had previous episodes of hypotension and hypoglycemia causing prolonged altered mental status however cannot for sure say that this is the cause. Ammonia 29.  Unable to get any history from family regarding baseline.  No one is reachable.  No other charts to review.   Impression: -Altered mental status of unknown etiology- reportedly on chart review has happened before to her with hypoglycemia and hypotension, both of which are present this time- documented blood pressure 60/51 and documented hypoglycemia on arrival.  Recommendations: -Urine culture pending -MRI -Will consider video EEG if not improving by tmorow.  D/W PCCM  Team  Attending Neurohospitalist Addendum Patient seen and examined with APP/Resident. Agree with the history and physical as documented above. Agree with the plan as documented, which I helped formulate. I have independently reviewed the chart, obtained history, review of systems and examined the patient.I have personally reviewed pertinent head/neck/spine imaging (CT/MRI).  CT head reviewed on a CD that was sent with the patient from outside hospital.  No acute changes.  No stroke, no bleed. Please feel free to call with any questions. --- Amie Portland, MD [facility] Pager: [phone]  If 7pm to 7am, please call on call as listed on AMION.  CRITICAL CARE ATTESTATION Performed by: Amie Portland, MD Total critical care time: 30 minutes Critical care time was exclusive of separately billable procedures and treating other patients and/or supervising APPs/Residents/Students Critical care was necessary to treat or prevent imminent or life-threatening deterioration due to toxic metabolic encephalopathy  This patient is critically ill and at significant risk for neurological worsening and/or death and care requires constant monitoring. Critical care was time spent personally by me on the following activities: development of treatment plan with patient and/or surrogate as well as nursing, discussions with consultants, evaluation of patient's response to treatment, examination of patient, obtaining history from patient or surrogate, ordering and performing treatments and interventions, ordering and review of laboratory studies, ordering and review of radiographic studies, pulse oximetry, re-evaluation of patient's condition, participation in multidisciplinary rounds and medical decision making of high complexity in the care of this patient.

## 2019-04-19 NOTE — Progress Notes (Signed)
Bair Hugger applier for temp of 93.9 F

## 2019-04-19 NOTE — Progress Notes (Addendum)
..   NAME:  Nancy Mullins, MRN:  [id number], DOB:  01-Jul-1946, LOS: 1 ADMISSION DATE:  04/18/2019, CONSULTATION DATE:  04/18/2019 REFERRING MD:  Eugene Garnet MD, CHIEF COMPLAINT:  AMS   Brief History   73 yo F w/ PMHX NIDDM, Hypothyroid, CKD, CHF, GERD & HDL presents from [place]. In Septic Shock intubated.   History of present illness   ( History obtained from chart from [facility]. Pt encephalopathic and intubated. No other documentation in EMR)  73 yr old female presented on 7/14 from [facility] (Nursing Home 2-3 months) where she had a sudden onset of altered mental status that began in the AM.  Per EMS the pt's baseline is AAO x 4. This morning she was more lethargic and somnolent. On initial assessment at [facility] ED the pt was alert and oriented only to self but lethargic. She was protecting her airway and was in no respiratory distress at that time moving all 4 extremities. She was unable to follow commands. Per documentation pt started to decline post arrival and was intubated for airway protection.  After intubation at 16:33 she became hypotensive  PCCM at [facility] accepted pt transfer.at 17:24  Per Daughter : she called to check on her Mom in the morning 7/14. She was told by the NH staff that Mom was lethargic with decreased responsiveness. She states that the patient has had similar episodes like this in the past, always with hypotension and hypoglycemia. It takes her several days to return to baseline . This is the first time she has required intubation. Her baseline per her daughter is  Alert and oriented to person and place. She does have a h/o dementia.    Significant labs from OSH:   UA Yellow turbid urine Spec grav 1.011 ph 5.5 protein 1+ Blood +3 Large Leukocytes negative Nitrites WBC>50 RBC >50 Bacteria 3+ UDS: negative POC GLUCOSE 62  Acetaminophen <1 Salicylate <2 Na 144, K+ 3.8 Cl- 115 HCO3- 23 AG 8 Gluc 113  Calcium 8.1 Albumin 2.2 Tot Protein 5.8 Bilirubin Tot 0.2 BUN 31 Cr 2.98 GFR 19 AST 12 ALT 15 ALK PHOS 81 CKMB 5.2 TSH 1.79   Past Medical History  Per chart from [place] HTN HLD ARTHRITIS GERD GOUT DEMENTIA NEPHROLITHIASIS  CKD CHF NIDDM w/ Neuropathy HYPERKALEMIA HYPOTHYROID PVD NEPHROLITHIASIS  Patient had a urine cx from 6/11 that on 6/13 resulted as Ecoli/ESBL .>>verified by [facility]   7/14 transferred to [facility]. Off all sedation, poor mental status. Hypotensive, received 3.5L crystalloid, received 1x albumin, received 1 PRBC 7/15 Improved BP. Ongoing poor mental status. Ordering EEG and MRI brain   Consults:  Neuro  Procedures:  7/14 1633>Endotracheally intubated at [facility] 7/14 CVC >>  Significant Diagnostic Tests:  CTH w/o contrast at [facility]: no acute intracranial process Chronic involution changes ( disc on chart) CXR at [facility]: hypoinflated lungs w/ vascular congestion/edema. ( disc on chart) CXR 7/14> likely bilateral layering effusions. Bibasilar atelectasis. Cardiomegaly. ETT and CVC in appropriate position   Micro Data:  04/18/2019>>>>Blood cx x 2 04/18/2019>>>>Urine cx  7/14 15:45> SARSCOV2 NEGATIVE from OSH   Antimicrobials:  04/18/2019 >>> Meropenum  04/18/2019 Ceftriaxone   Interim history/subjective:  Received 1 PRBC overnight Improved hypotension Remains encephalopathic   Objective   Blood pressure 106/68, pulse 80, temperature 99.5 F (37.5 C), resp. rate 15, weight 80 kg, SpO2 100 %. CVP:  [5 mmHg-6 mmHg] 6 mmHg  Vent Mode:  PRVC FiO2 (%):  [30 %] 30 % Set Rate:  [14 bmp] 14 bmp Vt Set:  [350 mL] 350 mL PEEP:  [5 cmH20] 5 cmH20 Plateau Pressure:  [10 cmH20-14 cmH20] 13 cmH20   Intake/Output Summary (Last 24 hours) at 04/19/2019 0724 Last data filed at 04/19/2019 0600 Gross per 24 hour  Intake 261.13 ml  Output 305 ml  Net -43.87 ml   Filed Weights   04/18/19 2100 04/19/19 0132  Weight: 80 kg 80 kg     Examination: General: Chronically ill appearing older adult F, intubated, NAD  HENT: NCAT, ETT secure, Trachea midline. Pink mmm  Lungs: Diminished bibasilar breath sounds. No wheezing, crackles or rhonchi  Cardiovascular: RRR s1s2 no rgm. Capillary refill < 3 seconds BUE BLE Extremities: Symmetrical bulk and tone. No obvious joint deformity. No cyanosis, no clubbing Neuro: 23m pupils, very sluggish. BLE withdraws from painful stimuli. Does not opens eyes or follow commands. Intermittent cough  GU: Foley collecting blood-tinged urine  Skin: clean, dry, warm, without rash. Skin breakdown under breast tissue, cracked heels    Assessment & Plan:   Acute Encephalopathy Progressive mental decline starting AM 7/14 Unclear etiology at this time May be secondary to sepsis from UTI Pt was never hypoxic per chart review OSH CT Head without acute abnormality  ? Hx of seizures (unable to verify at this time) however [facility] 77 [facility] [place] Ammonia 18 Plan: Continue to hold sedation Continue neuro checks Will order EEG and consult neuro  MRI brain  Follow up CMP  Follow up culture data as below   Acute Respiratory failure requiring intubation 2/2 declining mental status [facility] patient apneic  Plan: Titrate FiO2/PEEP for SpO2 > 92% Continue MV at this time  Continue pulm hygeine AM CXR  Shock, improved  -septic shock vs hypovolemic shock  Source: suspected UTI>>has a h/o ESBL from cx on 03/16/2019 Has a h/o CHF Pt became hypotensive shortly after intubation PCT 0.64, WBC 6.6  Cortisol 18.4  Hgb 6.9, received 1 PRBC overnight  Plan: MAP goal > 65  Dc pressors as patient meeting MAP goal without  Follow up BCx and UCx Continue contact precautions in setting of ESBL hx, continue mero per pharm dosing at this time and narrow pending micro data ECHO has been ordered    CKD IV  GFR 19  Indwelling foley placed at [facility] [employer] dosing of abx Minimize nephrotoxic agents as able Check CK  Strict I/O  Anemia -acute vs chronic disease  H/o CKD and multiple co morbidities Received 1 PRBc overnight for hgb 6.9 with associated hypotension  No signs of GI bleed, blood tinged urine however has a foley  Plan: Check CBC now, s/p 1 PRBC Follow up iron studies  Folvite   DM, non-insulin dependent  Hypoglycemic on arrival Plan: No SSI at this time in setting of acute hypoglycemia  EN per RDN   Best practice:  Diet: EN  Pain/Anxiety/Delirium protocol (if indicated): none  VAP protocol (if indicated): yes DVT prophylaxis: SQ heparin, SCDs  GI prophylaxis: Protonix Glucose control: monitor  Mobility: bedrest Code Status: Full  Family Communication: Daughter's HKaleeyah Cuffiephone number [phone]: ICU   Labs   CBC: Recent Labs  Lab 04/18/19 2344 04/18/19 2346  WBC  --  6.6  NEUTROABS  --  5.3  HGB 6.5* 6.9*  HCT 19.0* 21.9*  MCV  --  89.8  PLT  --  211    Basic Metabolic Panel: Recent Labs  Lab 04/18/19 2344 04/18/19 2346  NA 146*  --   K 3.5  --   CREATININE  --  2.92*   GFR: CrCl cannot be calculated (Unknown ideal weight.). Recent Labs  Lab 04/18/19 2346 04/19/19 0455  PROCALCITON 0.63 0.64  WBC 6.6  --     Liver Function Tests: No results for input(s): AST, ALT, ALKPHOS, BILITOT, PROT, ALBUMIN in the last 168 hours. No results for input(s): LIPASE, AMYLASE in the last 168 hours. No results for input(s): AMMONIA in the last 168 hours.  ABG    Component Value Date/Time   PHART 7.330 (L) 04/18/2019 2344   PCO2ART 35.5 04/18/2019 2344   PO2ART 95.0 04/18/2019 2344   HCO3 19.0 (L) 04/18/2019 2344   TCO2 20 (L) 04/18/2019 2344   ACIDBASEDEF 7.0 (H) 04/18/2019 2344   O2SAT 97.0 04/18/2019 2344     Coagulation Profile: Recent Labs  Lab 04/18/19 2346  INR 1.5*    Cardiac Enzymes: No results for input(s):  CKTOTAL, CKMB, CKMBINDEX, TROPONINI in the last 168 hours.  HbA1C: No results found for: HGBA1C  CBG: Recent Labs  Lab 04/18/19 2058 04/18/19 2118 04/18/19 2350 04/19/19 0344  GLUCAP 66* 120* 77 80    Critical care time: 45 minutes      Eliseo Gum MSN, AGACNP-BC [place] [phone] If no answer, [phone] 04/19/2019, 7:24 AM

## 2019-04-19 NOTE — Progress Notes (Signed)
 Nutrition Follow-up  DOCUMENTATION CODES:   Not applicable  INTERVENTION:   Tube Feeding:  Vital AF 1.2 at 50 ml/hr Pro-Stat 30 mL daily Provides 105 g of protein, 1540 kcals and 972 mL of free water  NUTRITION DIAGNOSIS:   Inadequate oral intake related to acute illness as evidenced by NPO status.  GOAL:   Patient will meet greater than or equal to 90% of their needs   MONITOR:   TF tolerance, Vent status, Skin, Labs, Weight trends  REASON FOR ASSESSMENT:   Consult, Ventilator Enteral/tube feeding initiation and management  ASSESSMENT:   73 yo female admitted with AMS, septic shock, acute respiratory failure requiring intubation. PMH includes DM, CHF, CKD IV, GERD, HLD, HTN, dementia  Patient is currently intubated on ventilator support MV: 5.7 L/min Temp (24hrs), Avg:98 F (36.7 C), Min:95.2 F (35.1 C), Max:99.9 F (37.7 C)  Nepro infusing at 30 ml/hr  Current wt 80 kg; 2+ edema generalized present. No previous weight encounters, unsure of dry weight.   Unable to obtain diet and weight history at this time  Labs: sodium 146 (H), Creatinine 3.09, BUN 27, potassium wdl, no phosphorus, CBGs 06-30 Meds: folic acid  NUTRITION - FOCUSED PHYSICAL EXAM:  Unable to assess, pt on precautions, deferred to preserve PPE  Diet Order:   Diet Order            Diet NPO time specified  Diet effective now              EDUCATION NEEDS:   Not appropriate for education at this time  Skin:  Skin Assessment: Reviewed RN Assessment  Last BM:  7/15  Height:   Ht Readings from Last 1 Encounters:  04/19/19 5\' 3"  (1.6 m)    Weight:   Wt Readings from Last 1 Encounters:  04/19/19 80 kg    Ideal Body Weight:  52.3 kg  BMI:  Body mass index is 31.24 kg/m.  Estimated Nutritional Needs:   Kcal:  1540 kcals  Protein:  100-130 g  Fluid:  >/= 1.5 L    Hadia Minier MS, RDN, LDN, CNSC [phone] Pager  [phone] Weekend/On-Call Pager

## 2019-04-20 ENCOUNTER — Inpatient Hospital Stay (HOSPITAL_COMMUNITY): Payer: Medicare Other

## 2019-04-20 LAB — BASIC METABOLIC PANEL
Anion gap: 9 (ref 5–15)
BUN: 34 mg/dL — ABNORMAL HIGH (ref 8–23)
CO2: 19 mmol/L — ABNORMAL LOW (ref 22–32)
Calcium: 7.9 mg/dL — ABNORMAL LOW (ref 8.9–10.3)
Chloride: 119 mmol/L — ABNORMAL HIGH (ref 98–111)
Creatinine, Ser: 3 mg/dL — ABNORMAL HIGH (ref 0.44–1.00)
GFR calc Af Amer: 17 mL/min — ABNORMAL LOW (ref >60)
GFR calc non Af Amer: 15 mL/min — ABNORMAL LOW (ref >60)
Glucose, Bld: 136 mg/dL — ABNORMAL HIGH (ref 70–99)
Potassium: 3.6 mmol/L (ref 3.5–5.1)
Sodium: 147 mmol/L — ABNORMAL HIGH (ref 135–145)

## 2019-04-20 LAB — GLUCOSE, CAPILLARY
Glucose-Capillary: 126 mg/dL — ABNORMAL HIGH (ref 70–99)
Glucose-Capillary: 124 mg/dL — ABNORMAL HIGH (ref 70–99)
Glucose-Capillary: 115 mg/dL — ABNORMAL HIGH (ref 70–99)
Glucose-Capillary: 141 mg/dL — ABNORMAL HIGH (ref 70–99)
Glucose-Capillary: 120 mg/dL — ABNORMAL HIGH (ref 70–99)

## 2019-04-20 LAB — CBC
HCT: 24.5 % — ABNORMAL LOW (ref 36.0–46.0)
Hemoglobin: 7.8 g/dL — ABNORMAL LOW (ref 12.0–15.0)
MCH: 28.1 pg (ref 26.0–34.0)
MCHC: 31.8 g/dL (ref 30.0–36.0)
MCV: 88.1 fL (ref 80.0–100.0)
Platelets: 151 10*3/uL (ref 150–400)
RBC: 2.78 MIL/uL — ABNORMAL LOW (ref 3.87–5.11)
RDW: 18.6 % — ABNORMAL HIGH (ref 11.5–15.5)
WBC: 6.3 10*3/uL (ref 4.0–10.5)
nRBC: 1 % — ABNORMAL HIGH (ref 0.0–0.2)

## 2019-04-20 LAB — T4, FREE: Free T4: 0.88 ng/dL (ref 0.61–1.12)

## 2019-04-20 LAB — PHOSPHORUS: Phosphorus: 2.2 mg/dL — ABNORMAL LOW (ref 2.5–4.6)

## 2019-04-20 LAB — MAGNESIUM: Magnesium: 1.7 mg/dL (ref 1.7–2.4)

## 2019-04-20 LAB — TSH: TSH: 2.417 u[IU]/mL (ref 0.350–4.500)

## 2019-04-20 LAB — PROCALCITONIN: Procalcitonin: 0.83 ng/mL

## 2019-04-20 MED ORDER — POTASSIUM & SODIUM PHOSPHATES 280-160-250 MG PO PACK
1.0000 | PACK | Freq: Once | ORAL | Status: AC
  Administered 2019-04-20: 1 via ORAL
  Filled 2019-04-20: qty 1

## 2019-04-20 MED ORDER — FREE WATER
100.0000 mL | Freq: Three times a day (TID) | Status: DC
  Administered 2019-04-20 – 2019-04-29 (×28): 100 mL

## 2019-04-20 NOTE — Progress Notes (Addendum)
NEUROLOGY PROGRESS NOTE  Subjective: Patient currently intubated on no sedation.  Not breathing over the vent. I was able to contact the Primary contact--Thomas , Hannhil   --son and [phone].  He states at baseline she has dementia, wheelchair-bound, needs help with all ADLs other than feeding herself.  She has been in the hospital 3 times for UTIs.  Each time she becomes encephalopathic for 2 to 3 days and then per son comes right back to normal.  This is the first time however that the patient has needed to be intubated, however she was found to be septic secondary to E. coli infection thus this infection might be worse than previous.  Exam: Vitals:   04/20/19 0900 04/20/19 1000  BP:    Pulse: 95 93  Resp: 15 (!) 9  Temp:    SpO2: 100% 100%    Physical Exam   HEENT-  Normocephalic, no lesions, without obvious abnormality.  Normal external eye and conjunctiva.   Extremities- Warm, dry and intact Musculoskeletal-no joint tenderness, deformity or swelling Skin-warm and dry, no hyperpigmentation, vitiligo, or suspicious lesions    Neuro:  Mental Status: Patient intubated at this time, does not respond to verbal stimuli but only noxious stimuli. Cranial Nerves: II: No blink to threat III,IV, VI: Slightly disconjugate V,VII: Face symmetric,  VIII: No response to verbal stimuli  Motor: With sternal rub right arm attempts to localize, with noxious stimuli of left arm withdraws slightly, right leg with noxious stimuli withdraws, left leg with noxious stimuli withdraws but to less extent of left leg Sensory: As above Plantars: Right: downgoing   Left: downgoing     Medications:  Scheduled: . chlorhexidine gluconate (MEDLINE KIT)  15 mL Mouth Rinse BID  . Chlorhexidine Gluconate Cloth  6 each Topical Q0600  . feeding supplement (PRO-STAT SUGAR FREE 64)  30 mL Per Tube Daily  . folic acid  1 mg Oral Daily  . free water  100 mL Per Tube Q8H  . gabapentin  300 mg Per  Tube BID  . heparin  5,000 Units Subcutaneous Q8H  . mouth rinse  15 mL Mouth Rinse 10 times per day  . mupirocin ointment  1 application Nasal BID  . pantoprazole (PROTONIX) IV  40 mg Intravenous QHS   Continuous: . sodium chloride Stopped (04/19/19 0436)  . feeding supplement (VITAL AF 1.2 CAL) 1,000 mL (04/19/19 1800)  . meropenem (MERREM) IV Stopped (04/20/19 0542)   PRN:  Pertinent Labs/Diagnostics: TSH 2.417 EEG: There is overall slowing with this slowing being more prominent with decreased amplitude over the right hemisphere-temporal region most notable.  Intermittent triphasic waves most notable over the left hemisphere.  Findings consistent with diffuse cerebral disturbance that is etiologically nonspecific, but may include a metabolic encephalopathy, among other possibilities.  Ammonia 29 Folate 7.1 Urine culture shows 60,000 colonies of E. coli   Mr Brain Wo Contrast  Result Date: 04/19/2019 CLINICAL DATA:  Encephalopathy. Altered mental status and septic shock. IMPRESSION: No acute finding. Advanced generalized brain atrophy. 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 Chronic small-vessel ischemic changes of the white matter. Electronically Signed   By: Nelson Chimes M.D.   On: 04/19/2019 17:47      Etta Quill PA-C [facility] [phone]   Assessment: 73 year old female presented to the hospital secondary to altered mental status of unknown etiology.  At this time urine culture did come back positive for 60,000 colonies of E. coli.  Along with, as reported prior, patient did have hypoglycemia and  hypotension prior to arrival.  Likely metabolic/toxic encephalopathy given septic shock.  Recommendations: - Continue to treat UTI and sepsis.    04/20/2019, 10:32 AM  Attending Neurohospitalist Addendum Patient seen and examined with APP/Resident. Agree with the history and physical as documented above. Agree with the plan as documented, which I helped  formulate. I have independently reviewed the chart, obtained history, review of systems and examined the patient.I have personally reviewed pertinent head/neck/spine imaging (CT/MRI). Please feel free to call with any questions. --- Amie Portland, MD [facility] Pager: [phone]  If 7pm to 7am, please call on call as listed on AMION.

## 2019-04-20 NOTE — Procedures (Signed)
Arterial Catheter Insertion Procedure Note Tamea Bai [id number] Mar 22, 1946  Procedure: Insertion of Arterial Catheter  Indications: Blood pressure monitoring  Procedure Details Consent: Unable to obtain consent because of emergent medical necessity. Time Out: Verified patient identification, verified procedure, site/side was marked, verified correct patient position, special equipment/implants available, medications/allergies/relevent history reviewed, required imaging and test results available.  Performed  Maximum sterile technique was used including antiseptics, cap, gloves, gown, hand hygiene, mask and sheet. Skin prep: Chlorhexidine; local anesthetic administered 20 gauge catheter was inserted into left radial artery using the Seldinger technique. ULTRASOUND GUIDANCE USED: NO Evaluation Blood flow good; BP tracing good. Complications: No apparent complications.   Yousaf Sainato, [place] P 04/20/2019

## 2019-04-20 NOTE — Progress Notes (Signed)
..   NAME:  Nancy Mullins, MRN:  [id number], DOB:  1946/01/20, LOS: 2 ADMISSION DATE:  04/18/2019, CONSULTATION DATE:  04/18/2019 REFERRING MD:  Eugene Garnet MD, CHIEF COMPLAINT:  AMS   Brief History   73 yo F w/ PMHX NIDDM, Hypothyroid, CKD, CHF, GERD & HDL presents from [place]. In Septic Shock intubated.   History of present illness   ( History obtained from chart from [facility]. Pt encephalopathic and intubated. No other documentation in EMR)  73 yr old female presented on 7/14 from [facility] (Nursing Home 2-3 months) where she had a sudden onset of altered mental status that began in the AM.  Per EMS the pt's baseline is AAO x 4. This morning she was more lethargic and somnolent. On initial assessment at [facility] ED the pt was alert and oriented only to self but lethargic. She was protecting her airway and was in no respiratory distress at that time moving all 4 extremities. She was unable to follow commands. Per documentation pt started to decline post arrival and was intubated for airway protection.  After intubation at 16:33 she became hypotensive  PCCM at [facility] accepted pt transfer.at 17:24  Per Daughter : she called to check on her Mom in the morning 7/14. She was told by the NH staff that Mom was lethargic with decreased responsiveness. She states that the patient has had similar episodes like this in the past, always with hypotension and hypoglycemia. It takes her several days to return to baseline . This is the first time she has required intubation. Her baseline per her daughter is  Alert and oriented to person and place. She does have a h/o dementia.    Significant labs from OSH:   UA Yellow turbid urine Spec grav 1.011 ph 5.5 protein 1+ Blood +3 Large Leukocytes negative Nitrites WBC>50 RBC >50 Bacteria 3+ UDS: negative POC GLUCOSE 62  Acetaminophen <1 Salicylate <2 Na 818, K+ 3.8 Cl- 115 HCO3- 23 AG 8 Gluc 113  Calcium 8.1 Albumin 2.2 Tot Protein 5.8 Bilirubin Tot 0.2 BUN 31 Cr 2.98 GFR 19 AST 12 ALT 15 ALK PHOS 81 CKMB 5.2 TSH 1.79   Past Medical History  Per chart from [facility] HTN HLD ARTHRITIS GERD GOUT DEMENTIA NEPHROLITHIASIS  CKD CHF NIDDM w/ Neuropathy HYPERKALEMIA HYPOTHYROID PVD NEPHROLITHIASIS  Patient had a urine cx from 6/11 that on 6/13 resulted as Ecoli/ESBL .>>verified by [facility]   7/14 transferred to [facility]. Off all sedation, poor mental status. Hypotensive, received 3.5L crystalloid, received 1x albumin, received 1 PRBC 7/15 Improved BP. Ongoing poor mental status. EEG generalized slowing. MRI brain no acute abnormality, old ischemic infarct, general atrophy.   Consults:  Neuro  Procedures:  7/14 1633>Endotracheally intubated at [facility] 7/14 CVC >>  Significant Diagnostic Tests:  CTH w/o contrast at [facility]: no acute intracranial process Chronic involution changes ( disc on chart) CXR at [facility]: hypoinflated lungs w/ vascular congestion/edema. ( disc on chart) CXR 7/14> likely bilateral layering effusions. Bibasilar atelectasis. Cardiomegaly. ETT and CVC in appropriate position  7/15 MRI Brain> no acute finding. Advanced generalized atrophy. 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 Chronic small vessel ischemic changes  7/15 EEG> abnormal EEG with overall slowing, slowing is more prominent over R hemisphere (most notably R temporal region)  7/15 ECHO> Low-normal systolic function, LVEF 56-31%. LV diastolic parameters suggest impaired relaxation. Increased LV end diastolic pressure.   Micro Data:  04/18/2019 BCx> no growth  24 hrs 04/18/2019 UCx >>>  7/14 15:45> SARSCOV2 NEGATIVE from OSH  7/14 MRSA> positive   Antimicrobials:  04/18/2019 >>> Meropenum  04/18/2019 Ceftriaxone   Interim history/subjective:  Attempted PSV/CPAP this morning, period of apnea followed by patient-initiated breaths at RR 11-13/min with appropriate tidal volumes.   NAEO Slight improvement in neuro exam this morning however remains poor  Objective   Blood pressure (!) 116/53, pulse 93, temperature 99 F (37.2 C), temperature source Bladder, resp. rate 14, height 5' 3"  (1.6 m), weight 82 kg, SpO2 100 %. CVP:  [3 mmHg-7 mmHg] 3 mmHg  Vent Mode: PRVC FiO2 (%):  [30 %] 30 % Set Rate:  [14 bmp] 14 bmp Vt Set:  [410 mL] 410 mL PEEP:  [5 cmH20] 5 cmH20 Plateau Pressure:  [10 cmH20-16 cmH20] 15 cmH20   Intake/Output Summary (Last 24 hours) at 04/20/2019 0732 Last data filed at 04/20/2019 0600 Gross per 24 hour  Intake 1570.08 ml  Output 485 ml  Net 1085.08 ml   Filed Weights   04/18/19 2100 04/19/19 0132 04/20/19 0335  Weight: 80 kg 80 kg 82 kg    Examination: General: Chronically ill, overweight, older adult F. Intubated, remains off sedation, NAD  HENT: NCAT ETT secure.  Lungs: Diminished bibasilar breath sounds however no adventitious sounds. Symmetrical chest expansion. No accessory muscle use  Cardiovascular: RRR s1s2. No JVD. Capillary refill < 3 seconds BUE BLE.  Extremities: 2+ pitting edema BLE. No cyanosis no clubbing. Symmetrical bulk and tone.  Neuro: PERRL 17m sluggish. Does not follow commands. Localizes to painful stimuli.   GU: Foley collecting dark blood-tinged urine Skin: warm, clean, dry, without rash    Assessment & Plan:   Acute Encephalopathy -suspect septic encephalopathy  Progressive mental decline starting AM 7/14 May be secondary to sepsis from UTI Pt was never hypoxic per chart review OSH CT Head without acute abnormality  On VPA for psychiatric disorder not seizure disorder  Ammonia 29 MRI brain without acute abnormality but does reveal atrophy EEG shows generalized slowing   Plan: Holding sedation WUA/SBT Continue neuro checks  Neuro has been consulted however anticipate likely neuro sign off in light of unremarkable MRI and EEG suggesting toxic metabolic etiology  Continue to follow up culture data   Will check TSH   Acute Respiratory failure requiring intubation 2/2 declining mental status. No record of hypoxia prior to intubation  [facility] patient apneic 7/15 SBT patient with period of apnea followed by patient-initiated breaths  Bilateral pleural effusions Plan: AM CXR Continue pulm hygiene Continue AM SBT/WUA Titrate PEEP/FiO2 for SpO2 goal >92% -- currently on PRVC RR 14 PEEP 5 Vt 410 FiO2 30% with SpO2 100% Effusions are not contributing to hypoxia, would not favor pursuing invasive intervention   Shock, improved  -suspect sepsis however do not have confirmed source at this time -suspect UTI in setting of ESBL on 03/16/19 cx.  -currentUCx data pending.  Has a h/o CHF -ECHO 7/15 LVEF 50-55%  Pt became hypotensive shortly after intubation PCT 0.64, WBC 6.6  Cortisol 18.4  Plan: MAP goal > 65  Continue to follow culture data. OSH data reportedly with + BCx-- pharmacy reaching out to micro lab RE  Continue contact precautions in setting of recent ESBL Continue mero per pharm  Continue to trend white count and temperature   CKD IV GFR 19 Indwelling foley placed at [place]> 25/hr  Plan: Pharm dosing of abx Minimize nephrotoxic agents as able Strict  I/O   Anemia  -acute vs chronic disease  Received 1 PRBC 7/14 for  H/o CKD and multiple co morbidities Received 1 PRBc overnight for hgb 6.9 with associated hypotension  No signs of GI bleed, blood tinged urine however has a foley  Plan: Trend CBC  Transfuse per unit protocol  Folvite   DM Acute Hypoglycemia Hypoglycemic on arrival Plan: No SSI, patient continues to be hypoglycemic or low-normal euglycemic EN per RDN  CBG monitoring. Correct hypoglycemia as needed Will initiate SSI as indicated   Blood-tinged urine P Continue to monitor UOP quality Suspect possible traumatic foley placement as urine appears blood tinged, without frank bleeding CK WNL    Hypophosphatemia Hypernatremia, mild  P Will replete phos now. Anticipate further normalization as patient continues on EN  Adding Free Water for mild hypernatremia  Follow BMP, check AM mag phos   Inadequate Oral Intake P EN per RDN   Best practice:  Diet: EN  Pain/Anxiety/Delirium protocol (if indicated): none VAP protocol (if indicated): yes DVT prophylaxis: SQ heparin, SCDs  GI prophylaxis: Protonix Glucose control: monitor  Mobility: bedrest Code Status: Full  Family Communication: Daughter's Suezanne Cheshire phone number [phone]. Discussed in detail 7/15  Disposition: ICU   Labs   CBC: Recent Labs  Lab 04/18/19 2344 04/18/19 2346 04/19/19 0751 04/20/19 0419  WBC  --  6.6 7.1 6.3  NEUTROABS  --  5.3  --   --   HGB 6.5* 6.9* 8.5* 7.8*  HCT 19.0* 21.9* 27.3* 24.5*  MCV  --  89.8 89.2 88.1  PLT  --  208 187 024    Basic Metabolic Panel: Recent Labs  Lab 04/18/19 2344 04/18/19 2346 04/19/19 0751 04/20/19 0419  NA 146*  --  146* 147*  K 3.5  --  3.9 3.6  CL  --   --  118* 119*  CO2  --   --  20* 19*  GLUCOSE  --   --  76 136*  BUN  --   --  27* 34*  CREATININE  --  2.92* 3.09* 3.00*  CALCIUM  --   --  7.8* 7.9*  MG  --   --  1.5* 1.7  PHOS  --   --   --  2.2*   GFR: Estimated Creatinine Clearance: 17.4 mL/min (A) (by C-G formula based on SCr of 3 mg/dL (H)). Recent Labs  Lab 04/18/19 2346 04/19/19 0455 04/19/19 0751 04/20/19 0419  PROCALCITON 0.63 0.64  --  0.83  WBC 6.6  --  7.1 6.3    Liver Function Tests: Recent Labs  Lab 04/19/19 0751  AST 10*  ALT 10  ALKPHOS 60  BILITOT 0.5  PROT 5.1*  ALBUMIN 2.0*   No results for input(s): LIPASE, AMYLASE in the last 168 hours. Recent Labs  Lab 04/19/19 1413  AMMONIA 29    ABG    Component Value Date/Time   PHART 7.330 (L) 04/18/2019 2344   PCO2ART 35.5 04/18/2019 2344   PO2ART 95.0 04/18/2019 2344   HCO3 19.0 (L) 04/18/2019 2344   TCO2 20 (L) 04/18/2019 2344   ACIDBASEDEF 7.0  (H) 04/18/2019 2344   O2SAT 97.0 04/18/2019 2344     Coagulation Profile: Recent Labs  Lab 04/18/19 2346  INR 1.5*    Cardiac Enzymes: Recent Labs  Lab 04/19/19 0748  CKTOTAL 18*    HbA1C: No results found for: HGBA1C  CBG: Recent Labs  Lab 04/19/19 1158 04/19/19 1558 04/19/19 1944 04/19/19 2349 04/20/19 0415  GLUCAP 84 86 100* 126* 126*    Critical care time: 40 minutes      Eliseo Gum MSN, AGACNP-BC [place] [phone] If no answer, [phone] 04/20/2019, 7:32 AM

## 2019-04-20 NOTE — Progress Notes (Signed)
PCCM Communication Note  I spoke with the patient's daughter Wille Glaser and provided daily updates.  All questions answered.   The patient's daughter also provider further insight into patient's baseline functional status.Patient is wheelchair bound and does not perform ADLs without significant assistance. Patient is communicative at baseline but often confused. Patient's daughter states she believes the patient was previously diagnosed with Dementia, but is not certain.   Eliseo Gum MSN, AGACNP-BC [place] [phone] If no answer, [phone] 04/20/2019, 8:44 AM

## 2019-04-21 ENCOUNTER — Inpatient Hospital Stay (HOSPITAL_COMMUNITY): Payer: Medicare Other

## 2019-04-21 DIAGNOSIS — B9620 Unspecified Escherichia coli [E. coli] as the cause of diseases classified elsewhere: Secondary | ICD-10-CM

## 2019-04-21 DIAGNOSIS — N390 Urinary tract infection, site not specified: Secondary | ICD-10-CM

## 2019-04-21 DIAGNOSIS — R7881 Bacteremia: Secondary | ICD-10-CM

## 2019-04-21 DIAGNOSIS — N179 Acute kidney failure, unspecified: Secondary | ICD-10-CM

## 2019-04-21 LAB — BASIC METABOLIC PANEL
Anion gap: 7 (ref 5–15)
BUN: 47 mg/dL — ABNORMAL HIGH (ref 8–23)
CO2: 19 mmol/L — ABNORMAL LOW (ref 22–32)
Calcium: 7.9 mg/dL — ABNORMAL LOW (ref 8.9–10.3)
Chloride: 120 mmol/L — ABNORMAL HIGH (ref 98–111)
Creatinine, Ser: 3.14 mg/dL — ABNORMAL HIGH (ref 0.44–1.00)
GFR calc Af Amer: 16 mL/min — ABNORMAL LOW (ref >60)
GFR calc non Af Amer: 14 mL/min — ABNORMAL LOW (ref >60)
Glucose, Bld: 126 mg/dL — ABNORMAL HIGH (ref 70–99)
Potassium: 3.9 mmol/L (ref 3.5–5.1)
Sodium: 146 mmol/L — ABNORMAL HIGH (ref 135–145)

## 2019-04-21 LAB — CBC
HCT: 21.6 % — ABNORMAL LOW (ref 36.0–46.0)
HCT: 27.7 % — ABNORMAL LOW (ref 36.0–46.0)
Hemoglobin: 6.8 g/dL — CL (ref 12.0–15.0)
Hemoglobin: 9 g/dL — ABNORMAL LOW (ref 12.0–15.0)
MCH: 28.1 pg (ref 26.0–34.0)
MCH: 28.6 pg (ref 26.0–34.0)
MCHC: 31.5 g/dL (ref 30.0–36.0)
MCHC: 32.5 g/dL (ref 30.0–36.0)
MCV: 89.3 fL (ref 80.0–100.0)
MCV: 87.9 fL (ref 80.0–100.0)
Platelets: 149 10*3/uL — ABNORMAL LOW (ref 150–400)
Platelets: 145 10*3/uL — ABNORMAL LOW (ref 150–400)
RBC: 2.42 MIL/uL — ABNORMAL LOW (ref 3.87–5.11)
RBC: 3.15 MIL/uL — ABNORMAL LOW (ref 3.87–5.11)
RDW: 18.6 % — ABNORMAL HIGH (ref 11.5–15.5)
RDW: 17.7 % — ABNORMAL HIGH (ref 11.5–15.5)
WBC: 7.7 10*3/uL (ref 4.0–10.5)
WBC: 9.7 10*3/uL (ref 4.0–10.5)
nRBC: 0.4 % — ABNORMAL HIGH (ref 0.0–0.2)
nRBC: 0.4 % — ABNORMAL HIGH (ref 0.0–0.2)

## 2019-04-21 LAB — PROTEIN / CREATININE RATIO, URINE
Creatinine, Urine: 62.63 mg/dL
Protein Creatinine Ratio: 0.83 mg/mg{Cre} — ABNORMAL HIGH (ref 0.00–0.15)
Total Protein, Urine: 52 mg/dL

## 2019-04-21 LAB — GLUCOSE, CAPILLARY
Glucose-Capillary: 117 mg/dL — ABNORMAL HIGH (ref 70–99)
Glucose-Capillary: 121 mg/dL — ABNORMAL HIGH (ref 70–99)
Glucose-Capillary: 121 mg/dL — ABNORMAL HIGH (ref 70–99)
Glucose-Capillary: 122 mg/dL — ABNORMAL HIGH (ref 70–99)
Glucose-Capillary: 123 mg/dL — ABNORMAL HIGH (ref 70–99)
Glucose-Capillary: 126 mg/dL — ABNORMAL HIGH (ref 70–99)

## 2019-04-21 LAB — OCCULT BLOOD X 1 CARD TO LAB, STOOL: Fecal Occult Bld: NEGATIVE

## 2019-04-21 LAB — MAGNESIUM: Magnesium: 1.7 mg/dL (ref 1.7–2.4)

## 2019-04-21 LAB — PHOSPHORUS: Phosphorus: 2.2 mg/dL — ABNORMAL LOW (ref 2.5–4.6)

## 2019-04-21 LAB — PREPARE RBC (CROSSMATCH)

## 2019-04-21 LAB — SODIUM, URINE, RANDOM: Sodium, Ur: 55 mmol/L

## 2019-04-21 MED ORDER — SODIUM CHLORIDE 0.9% IV SOLUTION
Freq: Once | INTRAVENOUS | Status: AC
  Administered 2019-04-21: 12:00:00 via INTRAVENOUS

## 2019-04-21 MED ORDER — MAGNESIUM SULFATE 2 GM/50ML IV SOLN
2.0000 g | Freq: Once | INTRAVENOUS | Status: AC
  Administered 2019-04-21: 2 g via INTRAVENOUS
  Filled 2019-04-21: qty 50

## 2019-04-21 NOTE — Progress Notes (Signed)
CRITICAL VALUE ALERT  Critical Value:  6.8  Date & Time Notied:  04/21/2019 at 7:15 am  Provider Notified: Eliseo Gum, NP  Orders Received/Actions taken: No frank bleeding noted at this time. Will continue to monitor per MD instructions.

## 2019-04-21 NOTE — Progress Notes (Addendum)
Name: Nancy Mullins  MRN: [id number] DOB: Aug 22, 1946 LOS: 3   ADMISSION TIME: 04/18/2019  8:37 PM CONSULTATION DATE: 04/18/19  REFERRING MD: [facility] CHIEF COMPLAINT: AMS  Brief History   Nancy Mullins is a 73 y.o. female with PMHx s/f NIDDM, hypothyroidism, CKD, CHF, GERD, HLD  Who presented from [facility] in [place] as transfer.  [facility] (nursing home 2 to 3 months) where she had a son in change in [place] on 04/18/2019 a.m.  Per EMS, baseline is ANO x4.   On arrival to [facility], patient was more lethargic and somnolent from earlier in the day and A& O to self only.  Patient declined post arrival and was intubated for airway protection followed by hypotension.  [name], PCCM accepted transfer at 74  Past Medical History  Per chart from [facility] 1. HTN 2. HLD 3. ARTHRITIS 4. GERD 5. GOUT 6. DEMENTIA 7. NEPHROLITHIASIS  8. CKD 9. CHF 10. NIDDM w/ Neuropathy 11. HYPERKALEMIA 12. HYPOTHYROID 13. PVD 14. [facility]   7/14 - accept transfer & pt admitted to [facility]. Poor mental status, hypotensive (s/p 3.5L cystalloide, albumin x 1, pRBC x 1). Off of all sedation  7/15 - improving BP, poor mental status. EEG w/ generalized slowing. 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 S/p 1 pRBC (6.5>8.5).  Echocardiogram 7/16 -child on PSG/CPAP, period of apnea followed by patient initiated breaths (RR 11 to 13/min) and appropriate volumes.  Slight improvement in AMS   Consults:  Neurology   Procedures:  04/18/19 _0  -- intubated at [facility]) 04/18/19 Central Venous Catheter   Significant Diagnostic Tests:  Significant labs from OSH:  UA: Yellow turbid urine, Spec grav 1.011 ph 5.5 protein 1+ Blood +3 Large Leukocytes negative Nitrites WBC>50 RBC >50 Bacteria 3+ UDS: negative POC GLUCOSE 62  Acetaminophen <1 Salicylate <2 Na 115, K+ 3.8 Cl-  115 HCO3- 23 AG 8 Gluc 113 Calcium 8.1 Albumin 2.2 Tot Protein 5.8 Bilirubin Tot 0.2 BUN 31 Cr 2.98 GFR 19 AST 12 ALT 15 ALK PHOS 81 CKMB 5.2 TSH 1.79  CT w/ at [facility] - no acute process. Chronic changes  CXR at [facility] - hypoinflated lungs with vascualr congestion/edema (disc in chart)  ------------------------------------------------------- [name] 7/15 MRI Brain> no acute finding. Advanced generalized atrophy. 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 Chronic small vessel ischemic changes  7/15 EEG> abnormal EEG with overall slowing, slowing is more prominent over R hemisphere (most notably R temporal region)  7/15 ECHO> Low-normal systolic function, LVEF 72-62%. LV diastolic parameters suggest impaired relaxation. Increased LV end diastolic pressure. CXR> 7/15 ET tube and central line stable. Cardiomegaly with small effusion.   Micro Data:  03/16/19   Had a urine cx from 6/11 that on 6/13 resulted as Ecoli/ESBL (verified by Pharmacy) 04/18/19  Blood cx neg, 1 bottle growing gram-negative bacilli (04/19/2019)  Urine Cx  MRSA positive    7/14 MC -blood culture x2 no growth x2 days  Antimicrobials:  7/14//20 - ceftriaxone 04/18/19 >> - Meropenem  Interim history/subjective:  Attempted to wean vent with evidence of patient initiated breaths. Patient failed and place back on vent   Objective   BP 105/61   Pulse 75   Temp (!) 97.2 F (36.2 C) (Bladder)   Resp 14   Ht 5' 3" (1.6 m)   Wt 83.6 kg   SpO2 100%   BMI 32.65 kg/m  Vent Mode: PRVC FiO2 (%):  [30 %] 30 % Set Rate:  [14 bmp] 14 bmp Vt Set:  [410 mL] 410 mL PEEP:  [5 cmH20] 5 cmH20 Plateau Pressure:  [14 cmH20-17 cmH20] 16 cmH20  Intake/Output      07/16 0701 - 07/17 0700 07/17 0701 - 07/18 0700   I.V. (mL/kg) 230 (2.8)    NG/GT 1350    IV Piggyback 200    Total Intake(mL/kg) 1780 (21.3)    Urine (mL/kg/hr) 735 (0.4)    Stool 0    Total Output 735    Net +1045         Stool Occurrence 1 x     Filed Weights   04/19/19 0132  04/20/19 0335 04/21/19 0445  Weight: 80 kg 82 kg 83.6 kg    Physical Exam:  General: lying in bed, with bear hugger. Sparse hair on head.  HEENT: [place]/AT. PERRLA. EOMI.  Cardiovascular: RRR, No BLEE Respiratory: Vent in place. Vent settings as above.  Abdomen: + BS. NT, ND, soft to palpation.  Extremities: Warm and well perfused. Moving spontaneously.  Integumentary: No obvious rashes, lesions, trauma on general exam. Neuro: responds to painful stimuli only. Plantar reflexes intact.       LABS  CBC: Recent Labs  Lab 04/18/19 2344 04/18/19 2346 04/19/19 0751 04/20/19 0419 04/21/19 0641  WBC  --  6.6 7.1 6.3 7.7  NEUTROABS  --  5.3  --   --   --   HGB 6.5* 6.9* 8.5* 7.8* 6.8*  HCT 19.0* 21.9* 27.3* 24.5* 21.6*  MCV  --  89.8 89.2 88.1 89.3  PLT  --  208 187 151 149*   CMP: Recent Labs  Lab 04/18/19 2344 04/18/19 2346 04/19/19 0751 04/20/19 0419 04/21/19 0641  NA 146*  --  146* 147* 146*  K 3.5  --  3.9 3.6 3.9  CL  --   --  118* 119* 120*  CO2  --   --  20* 19* 19*  GLUCOSE  --   --  76 136* 126*  BUN  --   --  27* 34* 47*  CREATININE  --  2.92* 3.09* 3.00* 3.14*  CALCIUM  --   --  7.8* 7.9* 7.9*  MG  --   --  1.5* 1.7 1.7  PHOS  --   --   --  2.2* 2.2*  ALBUMIN  --   --  2.0*  --   --      GFR: Estimated Creatinine Clearance: 16.8 mL/min (A) (by C-G formula based on SCr of 3.14 mg/dL (H)).  Liver Function Tests: Recent Labs  Lab 04/19/19 0751  AST 10*  ALT 10  ALKPHOS 60  BILITOT 0.5  PROT 5.1*  ALBUMIN 2.0*   Recent Labs  Lab 04/19/19 1413  AMMONIA 29   Coagulation Profile: Recent Labs  Lab 04/18/19 2346  INR 1.5*   Cardiac Enzymes: Recent Labs  Lab 04/19/19 0748  CKTOTAL 18*   CBG: Recent Labs  Lab 04/20/19 1541 04/20/19 1946 04/20/19 2334 04/21/19 0351 04/21/19 0751  GLUCAP 141* 120* 122* 121* 123*   Thyroid Function Tests: Recent Labs    04/20/19 0759  TSH 2.417  FREET4 0.88    Anemia Panel: Recent Labs    04/19/19  1413  FOLATE 7.1    Sepsis Labs: Recent Labs    04/18/19 2346 04/19/19 0455 04/20/19 0419  PROCALCITON 0.63 0.64 0.83   Recent Labs    04/18/19 2344  HCO3 19.0*   Patient Lines/Drains/Airways Status   Active Line/Drains/Airways    Name:   Placement date:   Placement time:   Site:   Days:   Peripheral IV 04/18/19 Right Antecubital   04/18/19    2100    Antecubital   3   Peripheral IV 04/18/19 Right Hand   04/18/19    2100    Hand   3   NG/OG Tube Orogastric Center mouth Xray   04/18/19    2111    Center mouth   3   Urethral Catheter Temperature probe   04/18/19    2100    Temperature probe   3   Airway 7.5 mm   04/18/19    2043     3          Assessment & Plan:   Active Problems:   Acute respiratory failure with hypoxia (HCC)   Encounter for intubation   Septic shock ([place])   Encephalopathy acute   Acute respiratory insufficiency   Endotracheally intubated   Central venous catheter in place  NEURO Altered mental status Continues to have AMS.  Possibly secondary to sepsis.  Neurology was consulted yesterday and performed EEG which generalized slowing.  MRI obtained with no acute findings was generalized atrophy.  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   Continue to monitor.   Neuro checks   PULM Acute respiratory failure Chest ray this morning with acute worsening of pulmonary edema, though respiratory status remain stable.  . Continue to monitor   CARDS CHF, stable Do not expect respiratory failure due to decompensated HF.  Marland Kitchen Continue tele  . Currently stable  ID ESBL UTI White count stable.  Catheterized urine sample from 04/19/2019 at 0800 showing >60,000 colonies of E. coli with susceptibilities to follow.  Cultures being reincubated at this time. . Continue day 4, 1g meropenem Q12 hours (7/14- ) . Continue foley cath   RENAL AKI? on CKD4 Currently, creatinine 3.14, BUN 47.  Absence of  previous records of creatinine and baseline renal function.  Still producing urine. Urine output 0.4 mL/Kg/h and total of 735 mils . Given pulmonary edema, can trial lasix.  . Trend Renal labs    GI # GT feeds   Per nutrition, appreciate care  ENDOCRINE NIDDM Stable . q4 CBG   HEME Anemia: Likely acute on chronic Hemoglobin is 6.8 this morning.  Patient is status post 1 PRBC.  In setting of cardiovascular diseases, patient's transfusion threshold is 8.  Should consider GI bleed in this setting as patient continues to have acute declines after resuscitation.  Heparin for VTE . FOBT  . Daily Protonix   Best practice:  Diet: EN  Pain/Anxiety/Delirium protocol (if indicated): none  VAP protocol (if indicated): yes DVT prophylaxis: SQ heparin, SCDs  GI prophylaxis: Protonix Glucose control: monitor  Mobility: bedrest Code Status: Full  Family Communication: Daughter's Aunya Lemler phone number [phone] Disposition: ICU      Wilber Oliphant, M.D.  PGY-2  Family Medicine  04/21/2019 7:28 AM

## 2019-04-22 LAB — GLUCOSE, CAPILLARY
Glucose-Capillary: 125 mg/dL — ABNORMAL HIGH (ref 70–99)
Glucose-Capillary: 130 mg/dL — ABNORMAL HIGH (ref 70–99)
Glucose-Capillary: 131 mg/dL — ABNORMAL HIGH (ref 70–99)
Glucose-Capillary: 137 mg/dL — ABNORMAL HIGH (ref 70–99)
Glucose-Capillary: 139 mg/dL — ABNORMAL HIGH (ref 70–99)
Glucose-Capillary: 150 mg/dL — ABNORMAL HIGH (ref 70–99)

## 2019-04-22 LAB — CBC WITH DIFFERENTIAL/PLATELET
Abs Immature Granulocytes: 0.48 10*3/uL — ABNORMAL HIGH (ref 0.00–0.07)
Basophils Absolute: 0 10*3/uL (ref 0.0–0.1)
Basophils Relative: 0 %
Eosinophils Absolute: 0.1 10*3/uL (ref 0.0–0.5)
Eosinophils Relative: 1 %
HCT: 28 % — ABNORMAL LOW (ref 36.0–46.0)
Hemoglobin: 8.9 g/dL — ABNORMAL LOW (ref 12.0–15.0)
Immature Granulocytes: 5 %
Lymphocytes Relative: 22 %
Lymphs Abs: 2.2 10*3/uL (ref 0.7–4.0)
MCH: 29.1 pg (ref 26.0–34.0)
MCHC: 31.8 g/dL (ref 30.0–36.0)
MCV: 91.5 fL (ref 80.0–100.0)
Monocytes Absolute: 0.7 10*3/uL (ref 0.1–1.0)
Monocytes Relative: 7 %
Neutro Abs: 6.5 10*3/uL (ref 1.7–7.7)
Neutrophils Relative %: 65 %
Platelets: 111 10*3/uL — ABNORMAL LOW (ref 150–400)
RBC: 3.06 MIL/uL — ABNORMAL LOW (ref 3.87–5.11)
RDW: 18.1 % — ABNORMAL HIGH (ref 11.5–15.5)
WBC: 10 10*3/uL (ref 4.0–10.5)
nRBC: 0.4 % — ABNORMAL HIGH (ref 0.0–0.2)

## 2019-04-22 LAB — BPAM RBC
Blood Product Expiration Date: 202008052359
Blood Product Expiration Date: 202008102359
ISSUE DATE / TIME: 202007150109
ISSUE DATE / TIME: 202007171114
Unit Type and Rh: 6200
Unit Type and Rh: 6200

## 2019-04-22 LAB — TYPE AND SCREEN
ABO/RH(D): A POS
Antibody Screen: NEGATIVE
Unit division: 0
Unit division: 0

## 2019-04-22 LAB — PHOSPHORUS: Phosphorus: 3 mg/dL (ref 2.5–4.6)

## 2019-04-22 LAB — COMPREHENSIVE METABOLIC PANEL
ALT: 31 U/L (ref 0–44)
AST: 73 U/L — ABNORMAL HIGH (ref 15–41)
Albumin: 1.8 g/dL — ABNORMAL LOW (ref 3.5–5.0)
Alkaline Phosphatase: 60 U/L (ref 38–126)
Anion gap: 7 (ref 5–15)
BUN: 58 mg/dL — ABNORMAL HIGH (ref 8–23)
CO2: 20 mmol/L — ABNORMAL LOW (ref 22–32)
Calcium: 8.2 mg/dL — ABNORMAL LOW (ref 8.9–10.3)
Chloride: 119 mmol/L — ABNORMAL HIGH (ref 98–111)
Creatinine, Ser: 2.98 mg/dL — ABNORMAL HIGH (ref 0.44–1.00)
GFR calc Af Amer: 18 mL/min — ABNORMAL LOW (ref >60)
GFR calc non Af Amer: 15 mL/min — ABNORMAL LOW (ref >60)
Glucose, Bld: 144 mg/dL — ABNORMAL HIGH (ref 70–99)
Potassium: 5 mmol/L (ref 3.5–5.1)
Sodium: 146 mmol/L — ABNORMAL HIGH (ref 135–145)
Total Bilirubin: 1 mg/dL (ref 0.3–1.2)
Total Protein: 4.7 g/dL — ABNORMAL LOW (ref 6.5–8.1)

## 2019-04-22 LAB — URINE CULTURE: Culture: 60000 — AB

## 2019-04-22 LAB — MAGNESIUM: Magnesium: 2.2 mg/dL (ref 1.7–2.4)

## 2019-04-22 MED ORDER — DIVALPROEX SODIUM 500 MG PO DR TAB
750.0000 mg | DELAYED_RELEASE_TABLET | Freq: Two times a day (BID) | ORAL | Status: DC
  Administered 2019-04-22 – 2019-04-25 (×7): 750 mg via ORAL
  Filled 2019-04-22 (×8): qty 1

## 2019-04-22 MED ORDER — PANTOPRAZOLE SODIUM 40 MG PO PACK
40.0000 mg | PACK | Freq: Every day | ORAL | Status: DC
  Administered 2019-04-22 – 2019-05-02 (×11): 40 mg
  Filled 2019-04-22 (×11): qty 20

## 2019-04-22 MED ORDER — LINEZOLID 600 MG/300ML IV SOLN
600.0000 mg | Freq: Two times a day (BID) | INTRAVENOUS | Status: AC
  Administered 2019-04-22 – 2019-04-26 (×10): 600 mg via INTRAVENOUS
  Filled 2019-04-22 (×11): qty 300

## 2019-04-22 NOTE — Progress Notes (Signed)
Name: Nancy Mullins  MRN: [id number] DOB: 13-Aug-1946 LOS: 4   ADMISSION TIME: 04/18/2019  8:37 PM CONSULTATION DATE: 04/18/19  REFERRING MD: [facility] [place] CHIEF COMPLAINT: AMS  Brief History   Nancy Mullins is a 73 y.o. female with PMHx s/f NIDDM, hypothyroidism, CKD, CHF, GERD, HLD  Who presented from [facility] in [place] as transfer.  Patient arrived [facility] from [facility] (nursing home 2 to 3 months) where she had a son in change in [place] on 04/18/2019 a.m.  Per EMS, baseline is ANO x4.   On arrival to [facility], patient was more lethargic and somnolent from earlier in the day and A& O to self only.  Patient declined post arrival and was intubated for airway protection followed by hypotension.  [name], PCCM accepted transfer at 45  Past Medical History  Per chart from [facility] 1. HTN 2. HLD 3. ARTHRITIS 4. GERD 5. GOUT 6. DEMENTIA 7. NEPHROLITHIASIS  8. CKD 9. CHF 10. NIDDM w/ Neuropathy 11. HYPERKALEMIA 12. HYPOTHYROID 13. PVD 14. [facility] Events   7/14 - accept transfer & pt admitted to [facility]. Poor mental status, hypotensive (s/p 3.5L cystalloide, albumin x 1, pRBC x 1). Off of all sedation  7/15 - improving BP, poor mental status. EEG w/ generalized slowing. 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 S/p 1 pRBC (6.5>8.5).  Echocardiogram 7/16 -child on PSG/CPAP, period of apnea followed by patient initiated breaths (RR 11 to 13/min) and appropriate volumes.  Slight improvement in AMS   Consults:  Neurology   Procedures:  04/18/19 _0  -- intubated at [facility] [place]) 04/18/19 Central Venous Catheter   Significant Diagnostic Tests:  Significant labs from OSH:  UA: Yellow turbid urine, Spec grav 1.011 ph 5.5 protein 1+ Blood +3 Large Leukocytes negative Nitrites WBC>50 RBC >50 Bacteria 3+ UDS: negative POC GLUCOSE 62  Acetaminophen <1 Salicylate <2 Na 034, K+ 3.8 Cl-  115 HCO3- 23 AG 8 Gluc 113 Calcium 8.1 Albumin 2.2 Tot Protein 5.8 Bilirubin Tot 0.2 BUN 31 Cr 2.98 GFR 19 AST 12 ALT 15 ALK PHOS 81 CKMB 5.2 TSH 1.79  CT w/ at [facility] - no acute process. Chronic changes  CXR at [facility] - hypoinflated lungs with vascualr congestion/edema (disc in chart)  ------------------------------------------------------- [name] 7/15 MRI Brain> no acute finding. Advanced generalized atrophy. 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 Chronic small vessel ischemic changes  7/15 EEG> abnormal EEG with overall slowing, slowing is more prominent over R hemisphere (most notably R temporal region)  7/15 ECHO> Low-normal systolic function, LVEF 91-79%. LV diastolic parameters suggest impaired relaxation. Increased LV end diastolic pressure. CXR> 7/15 ET tube and central line stable. Cardiomegaly with small effusion.   Micro Data:  03/16/19   Had a urine cx from 6/11 that on 6/13 resulted as Ecoli/ESBL (verified by Pharmacy) 04/18/19  Blood cx neg, 1 bottle growing gram-negative bacilli (04/19/2019)   MRSA positive    7/14 MC -blood culture x2 no growth x2 days  7/15 UCx >E Coli -VRE  Antimicrobials:  7/14//20 - ceftriaxone 04/18/19 >> - Meropenem   Interim history/subjective:  Weaning well this am.  Awakes and follows commands intermitently    Objective   BP 127/67 (BP Location: Left Arm)   Pulse 71   Temp (!) 97.3 F (36.3 C) (Bladder)   Resp 14   Ht _1  (1.6 m)   Wt 85.9 kg   SpO2 100%  BMI 33.55 kg/m   Vent Mode: PSV;CPAP FiO2 (%):  [30 %] 30 % Set Rate:  [14 bmp] 14 bmp Vt Set:  [410 mL] 410 mL PEEP:  [5 cmH20] 5 cmH20 Pressure Support:  [8 KZS01-09 cmH20] 8 cmH20 Plateau Pressure:  [10 cmH20-18 cmH20] 17 cmH20  Intake/Output      07/17 0701 - 07/18 0700 07/18 0701 - 07/19 0700   I.V. (mL/kg) 230 (2.7) 10 (0.1)   Blood 315    NG/GT 1420 50   IV Piggyback 250    Total Intake(mL/kg) 2215 (25.8) 60 (0.7)   Urine (mL/kg/hr) 665 (0.3)    Stool     Total Output  665    Net +1550 +60         Filed Weights   04/20/19 0335 04/21/19 0445 04/22/19 0500  Weight: 82 kg 83.6 kg 85.9 kg    Physical Exam:  General: lying in bed, with bear hugger. Sparse hair on head.  HEENT: [place]/AT. ETT  Cardiovascular: RRR, 1+ edema  Respiratory: Vent in place. Vent settings as above.  Abdomen: + BS. NT, ND, soft to palpation. TF  Extremities: Warm and well perfused. Moving spontaneously.  Integumentary: No obvious rashes, lesions, trauma on general exam. Neuro: followed simple command this am per RN        LABS  CBC: Recent Labs  Lab 04/18/19 2344 04/18/19 2346 04/19/19 0751 04/20/19 0419 04/21/19 0641  WBC  --  6.6 7.1 6.3 7.7  NEUTROABS  --  5.3  --   --   --   HGB 6.5* 6.9* 8.5* 7.8* 6.8*  HCT 19.0* 21.9* 27.3* 24.5* 21.6*  MCV  --  89.8 89.2 88.1 89.3  PLT  --  208 187 151 149*   CMP: Recent Labs  Lab 04/18/19 2344 04/18/19 2346 04/19/19 0751 04/20/19 0419 04/21/19 0641  NA 146*  --  146* 147* 146*  K 3.5  --  3.9 3.6 3.9  CL  --   --  118* 119* 120*  CO2  --   --  20* 19* 19*  GLUCOSE  --   --  76 136* 126*  BUN  --   --  27* 34* 47*  CREATININE  --  2.92* 3.09* 3.00* 3.14*  CALCIUM  --   --  7.8* 7.9* 7.9*  MG  --   --  1.5* 1.7 1.7  PHOS  --   --   --  2.2* 2.2*  ALBUMIN  --   --  2.0*  --   --      GFR: Estimated Creatinine Clearance: 16.8 mL/min (A) (by C-G formula based on SCr of 3.14 mg/dL (H)).  Liver Function Tests: Recent Labs  Lab 04/19/19 0751  AST 10*  ALT 10  ALKPHOS 60  BILITOT 0.5  PROT 5.1*  ALBUMIN 2.0*   Recent Labs  Lab 04/19/19 1413  AMMONIA 29   Coagulation Profile: Recent Labs  Lab 04/18/19 2346  INR 1.5*   Cardiac Enzymes: Recent Labs  Lab 04/19/19 0748  CKTOTAL 18*   CBG: Recent Labs  Lab 04/20/19 1541 04/20/19 1946 04/20/19 2334 04/21/19 0351 04/21/19 0751  GLUCAP 141* 120* 122* 121* 123*   Thyroid Function Tests: Recent Labs    04/20/19 0759  TSH 2.417  FREET4 0.88     Anemia Panel: Recent Labs    04/19/19 1413  FOLATE 7.1    Sepsis Labs: Recent Labs    04/18/19 2346 04/19/19 0455 04/20/19 0419  PROCALCITON 0.63 0.64 0.83   Recent Labs    04/18/19 2344  HCO3 19.0*   Patient Lines/Drains/Airways Status   Active Line/Drains/Airways    Name:   Placement date:   Placement time:   Site:   Days:   Peripheral IV 04/18/19 Right Antecubital   04/18/19    2100    Antecubital   3   Peripheral IV 04/18/19 Right Hand   04/18/19    2100    Hand   3   NG/OG Tube Orogastric Center mouth Xray   04/18/19    2111    Center mouth   3   Urethral Catheter Temperature probe   04/18/19    2100    Temperature probe   3   Airway 7.5 mm   04/18/19    2043     3          Assessment & Plan:   Active Problems:   Acute respiratory failure with hypoxia (HCC)   Encounter for intubation   Septic shock ([place])   Encephalopathy acute   Acute respiratory insufficiency   Endotracheally intubated   Central venous catheter in place  NEURO Altered mental status Continues to have AMS.  Possibly secondary to sepsis.  Neurology was consulted yesterday and performed EEG which generalized slowing.  MRI obtained with no acute findings was generalized atrophy.  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   Continue to monitor.   Neuro checks   PULM Acute respiratory failure secondary to Sepsis /acute metabolic encephalopathy  .   Marland Kitchen Vent support  . VAP    CARDS CHF, stable Monitor for decompensated HF.  Marland Kitchen Continue tele  . Currently stable  ID ESBL UTI VRE  White count stable.  Catheterized urine sample from 04/19/2019 at 0800 showing >60,000 colonies of E. coli with susceptibilities to follow.  Cultures being reincubated at this time. . Continue day 4, 1g meropenem Q12 hours (7/14- ) . Continue foley cath  . Will discuss with pharm, consider adding linezolid   RENAL AKI? on CKD4 . Scr tr down 3.14 >2.98  , UOP 60cc/hr  . Trend Renal labs    GI # GT feeds   Per nutrition, appreciate care  ENDOCRINE NIDDM Stable, BS <150 , add SSI ad indicated.  Marland Kitchen q4 CBG   HEME Anemia: Likely acute on chronic hbg stable >6.8 >8.9 (s/p 1 prbc ) Heparin for VTE . FOBT  . Daily Protonix   Best practice:  Diet: TF  Pain/Anxiety/Delirium protocol (if indicated): none  VAP protocol (if indicated): yes DVT prophylaxis: SQ heparin, SCDs  GI prophylaxis: Protonix Glucose control: monitor  Mobility: bedrest Code Status: Full  Family Communication: Daughter's Any Mcneice phone number [phone] Disposition: ICU        NP-C  [facility]  [phone]  04/22/2019   04/22/2019 9:11 AM

## 2019-04-23 ENCOUNTER — Inpatient Hospital Stay (HOSPITAL_COMMUNITY): Payer: Medicare Other

## 2019-04-23 LAB — CULTURE, BLOOD (ROUTINE X 2)
Culture: NO GROWTH
Culture: NO GROWTH
Special Requests: ADEQUATE
Special Requests: ADEQUATE

## 2019-04-23 LAB — CBC
HCT: 27 % — ABNORMAL LOW (ref 36.0–46.0)
Hemoglobin: 8.3 g/dL — ABNORMAL LOW (ref 12.0–15.0)
MCH: 28.3 pg (ref 26.0–34.0)
MCHC: 30.7 g/dL (ref 30.0–36.0)
MCV: 92.2 fL (ref 80.0–100.0)
Platelets: 128 10*3/uL — ABNORMAL LOW (ref 150–400)
RBC: 2.93 MIL/uL — ABNORMAL LOW (ref 3.87–5.11)
RDW: 18.1 % — ABNORMAL HIGH (ref 11.5–15.5)
WBC: 9.9 10*3/uL (ref 4.0–10.5)
nRBC: 0 % (ref 0.0–0.2)

## 2019-04-23 LAB — BASIC METABOLIC PANEL
Anion gap: 9 (ref 5–15)
BUN: 64 mg/dL — ABNORMAL HIGH (ref 8–23)
CO2: 21 mmol/L — ABNORMAL LOW (ref 22–32)
Calcium: 8.1 mg/dL — ABNORMAL LOW (ref 8.9–10.3)
Chloride: 116 mmol/L — ABNORMAL HIGH (ref 98–111)
Creatinine, Ser: 2.93 mg/dL — ABNORMAL HIGH (ref 0.44–1.00)
GFR calc Af Amer: 18 mL/min — ABNORMAL LOW (ref >60)
GFR calc non Af Amer: 15 mL/min — ABNORMAL LOW (ref >60)
Glucose, Bld: 162 mg/dL — ABNORMAL HIGH (ref 70–99)
Potassium: 4.8 mmol/L (ref 3.5–5.1)
Sodium: 146 mmol/L — ABNORMAL HIGH (ref 135–145)

## 2019-04-23 LAB — GLUCOSE, CAPILLARY
Glucose-Capillary: 127 mg/dL — ABNORMAL HIGH (ref 70–99)
Glucose-Capillary: 143 mg/dL — ABNORMAL HIGH (ref 70–99)
Glucose-Capillary: 144 mg/dL — ABNORMAL HIGH (ref 70–99)
Glucose-Capillary: 149 mg/dL — ABNORMAL HIGH (ref 70–99)
Glucose-Capillary: 150 mg/dL — ABNORMAL HIGH (ref 70–99)
Glucose-Capillary: 172 mg/dL — ABNORMAL HIGH (ref 70–99)

## 2019-04-23 NOTE — Progress Notes (Signed)
Pharmacy Antibiotic Note  Nancy Mullins is a 72 y.o. female admitted on 04/18/2019 with sepsis d/t suspected UTI w/ recent h/o ESBL E.coli.  Pharmacy has been consulted for Merrem dosing.   Micro from OSH: Urine cx: ESBL E.coli sensitive to meropenem BC: ESBL E.coli sensitive to meropenem  Micro from [facility]: Urine cx 7/15: 60,000 colonies ESBL E.coli, VRE MRSA PCR 7/14: Positive BC 7/14: ngtd  Patient is on day 5 of meropenem therapy for ESBL in blood and urine, follow for course completion in next couple days. VRE in urine, treating for 5 days with linezolid, stop date in place   Plan: Merrem 1g IV Q12H Linezolid 600 IV q12h  Height: 5\' 3"  (160 cm) Weight: 189 lb 9.5 oz (86 kg) IBW/kg (Calculated) : 52.4  Temp (24hrs), Avg:98.4 F (36.9 C), Min:97.3 F (36.3 C), Max:99.9 F (37.7 C)   Allergies  Allergen Reactions  . Benzonatate   . Codeine   . Lithium     Thank you for allowing pharmacy to be a part of this patient's care.  Nicoletta Dress, PharmD PGY2 Infectious Disease Pharmacy Resident

## 2019-04-23 NOTE — Progress Notes (Signed)
Name: Nancy Mullins  MRN: [id number] DOB: Jan 14, 1946 LOS: 5   ADMISSION TIME: 04/18/2019  8:37 PM CONSULTATION DATE: 04/18/19  REFERRING MD: O[facility] [place] CHIEF COMPLAINT: AMS  Brief History   Nancy Mullins is a 73 y.o. female with PMHx s/f NIDDM, hypothyroidism, CKD, CHF, GERD, HLD  Who presented from [facility] in [place] as transfer.  Patient arrived [facility] from [facility] (nursing home 2 to 3 months) where she had a son in change in [place] on 04/18/2019 a.m.  Per EMS, baseline is ANO x4.   On arrival to [facility], patient was more lethargic and somnolent from earlier in the day and A& O to self only.  Patient declined post arrival and was intubated for airway protection followed by hypotension.  [name], PCCM accepted transfer at 51  Past Medical History  Per chart from [facility] 1. HTN 2. HLD 3. ARTHRITIS 4. GERD 5. GOUT 6. DEMENTIA 7. NEPHROLITHIASIS  8. CKD 9. CHF 10. NIDDM w/ Neuropathy 11. HYPERKALEMIA 12. HYPOTHYROID 13. PVD 14. [place] [facility] Events   7/14 - accept transfer & pt admitted to [facility]. Poor mental status, hypotensive (s/p 3.5L cystalloide, albumin x 1, pRBC x 1). Off of all sedation  7/15 - improving BP, poor mental status. EEG w/ generalized slowing. 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 S/p 1 pRBC (6.5>8.5).  Echocardiogram 7/16 -child on PSG/CPAP, period of apnea followed by patient initiated breaths (RR 11 to 13/min) and appropriate volumes.  Slight improvement in AMS   Consults:  Neurology   Procedures:  04/18/19 @1633  -- intubated at [facility] [place]) 04/18/19 Central Venous Catheter   Significant Diagnostic Tests:  Significant labs from OSH:  UA: Yellow turbid urine, Spec grav 1.011 ph 5.5 protein 1+ Blood +3 Large Leukocytes negative Nitrites WBC>50 RBC >50 Bacteria 3+ UDS: negative POC GLUCOSE 62  Acetaminophen <1 Salicylate <2 Na 599, K+ 3.8 Cl-  115 HCO3- 23 AG 8 Gluc 113 Calcium 8.1 Albumin 2.2 Tot Protein 5.8 Bilirubin Tot 0.2 BUN 31 Cr 2.98 GFR 19 AST 12 ALT 15 ALK PHOS 81 CKMB 5.2 TSH 1.79  CT w/ at [facility] - no acute process. Chronic changes  CXR at [facility] - hypoinflated lungs with vascualr congestion/edema (disc in chart)  ------------------------------------------------------- [name] 7/15 MRI Brain> no acute finding. Advanced generalized atrophy. 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 Chronic small vessel ischemic changes  7/15 EEG> abnormal EEG with overall slowing, slowing is more prominent over R hemisphere (most notably R temporal region)  7/15 ECHO> Low-normal systolic function, LVEF 35-70%. LV diastolic parameters suggest impaired relaxation. Increased LV end diastolic pressure. CXR> 7/15 ET tube and central line stable. Cardiomegaly with small effusion.   Micro Data:  03/16/19   Had a urine cx from 6/11 that on 6/13 resulted as Ecoli/ESBL (verified by Pharmacy) 04/18/19  Blood cx neg, 1 bottle growing gram-negative bacilli (04/19/2019)   MRSA positive    7/14 MC -blood culture x2 >>NEG   7/15 UCx >E Coli -VRE  Antimicrobials:  7/14//20 - ceftriaxone 04/18/19 >> - Meropenem  7/18 Linezolid >>  Interim history/subjective:  Weaning well on vent  Per RN , followed simple command, my exam not following commands , responds to noxious stimuli    Objective   BP 121/65   Pulse 64   Temp (!) 97.3 F (36.3 C) (Core)   Resp 13   Ht 5' 3"  (1.6 m)   Wt  86 kg   SpO2 100%   BMI 33.59 kg/m   Vent Mode: PSV;CPAP FiO2 (%):  [30 %] 30 % Set Rate:  [14 bmp] 14 bmp Vt Set:  [410 mL] 410 mL PEEP:  [5 cmH20] 5 cmH20 Pressure Support:  [8 cmH20] 8 cmH20 Plateau Pressure:  [17 cmH20] 17 cmH20  Intake/Output      07/18 0701 - 07/19 0700 07/19 0701 - 07/20 0700   I.V. (mL/kg) 230 (2.7)    Blood     NG/GT 1400 150   IV Piggyback 946.7    Total Intake(mL/kg) 2576.7 (30) 150 (1.7)   Urine (mL/kg/hr) 780 (0.4)    Stool 0    Total  Output 780    Net +1796.7 +150        Stool Occurrence 1 x 1 x    Filed Weights   04/21/19 0445 04/22/19 0500 04/23/19 0500  Weight: 83.6 kg 85.9 kg 86 kg    Physical Exam:  General: lying in bed, with bear hugger. Sparse hair on head.  HEENT: [place]/AT. ETT  Cardiovascular: RRR, 1+ edema  Respiratory: CTA , vent .  Abdomen: + BS. NT, ND, soft to palpation. TF  Extremities: Warm and well perfused. Integumentary: No obvious rashes, lesions, trauma on general exam. Neuro: followed simple command this am per RN  , w/d noxious stimuli       LABS  CBC: Recent Labs  Lab 04/18/19 2344 04/18/19 2346 04/19/19 0751 04/20/19 0419 04/21/19 0641  WBC  --  6.6 7.1 6.3 7.7  NEUTROABS  --  5.3  --   --   --   HGB 6.5* 6.9* 8.5* 7.8* 6.8*  HCT 19.0* 21.9* 27.3* 24.5* 21.6*  MCV  --  89.8 89.2 88.1 89.3  PLT  --  208 187 151 149*   CMP: Recent Labs  Lab 04/18/19 2344 04/18/19 2346 04/19/19 0751 04/20/19 0419 04/21/19 0641  NA 146*  --  146* 147* 146*  K 3.5  --  3.9 3.6 3.9  CL  --   --  118* 119* 120*  CO2  --   --  20* 19* 19*  GLUCOSE  --   --  76 136* 126*  BUN  --   --  27* 34* 47*  CREATININE  --  2.92* 3.09* 3.00* 3.14*  CALCIUM  --   --  7.8* 7.9* 7.9*  MG  --   --  1.5* 1.7 1.7  PHOS  --   --   --  2.2* 2.2*  ALBUMIN  --   --  2.0*  --   --      GFR: Estimated Creatinine Clearance: 16.8 mL/min (A) (by C-G formula based on SCr of 3.14 mg/dL (H)).  Liver Function Tests: Recent Labs  Lab 04/19/19 0751  AST 10*  ALT 10  ALKPHOS 60  BILITOT 0.5  PROT 5.1*  ALBUMIN 2.0*   Recent Labs  Lab 04/19/19 1413  AMMONIA 29   Coagulation Profile: Recent Labs  Lab 04/18/19 2346  INR 1.5*   Cardiac Enzymes: Recent Labs  Lab 04/19/19 0748  CKTOTAL 18*   CBG: Recent Labs  Lab 04/20/19 1541 04/20/19 1946 04/20/19 2334 04/21/19 0351 04/21/19 0751  GLUCAP 141* 120* 122* 121* 123*   Thyroid Function Tests: Recent Labs    04/20/19 0759  TSH 2.417   FREET4 0.88    Anemia Panel: Recent Labs    04/19/19 1413  FOLATE 7.1    Sepsis Labs: Recent  Labs    04/18/19 2346 04/19/19 0455 04/20/19 0419  PROCALCITON 0.63 0.64 0.83   Recent Labs    04/18/19 2344  HCO3 19.0*   Patient Lines/Drains/Airways Status   Active Line/Drains/Airways    Name:   Placement date:   Placement time:   Site:   Days:   Peripheral IV 04/18/19 Right Antecubital   04/18/19    2100    Antecubital   3   Peripheral IV 04/18/19 Right Hand   04/18/19    2100    Hand   3   NG/OG Tube Orogastric Center mouth Xray   04/18/19    2111    Center mouth   3   Urethral Catheter Temperature probe   04/18/19    2100    Temperature probe   3   Airway 7.5 mm   04/18/19    2043     3          Assessment & Plan:   Active Problems:   Acute respiratory failure with hypoxia (HCC)   Encounter for intubation   Septic shock ([place])   Encephalopathy acute   Acute respiratory insufficiency   Endotracheally intubated   Central venous catheter in place  NEURO Altered mental status Continues to have AMS.  Possibly secondary to sepsis.  Neurology was consulted yesterday and performed EEG which generalized slowing.  MRI obtained with no acute findings was generalized atrophy.  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   Continue to monitor.   Neuro checks   PULM Acute respiratory failure secondary to Sepsis /acute metabolic encephalopathy  .  neuro status is limiting factor  .  Marland Kitchen Vent support  . VAP   Daily assess for wean   CARDS CHF, stable Monitor for decompensated HF.  Marland Kitchen Continue tele  . Currently stable  ID ESBL UTI VRE  White count stable.  Catheterized urine sample from 04/19/2019 at 0800 showing >60,000 colonies of E. coli with susceptibilities to follow.  Cultures being reincubated at this time. . Continue day 4, 1g meropenem Q12 hours (7/14- ) . Continue foley cath  . linezolid 7/18    RENAL AKI? on CKD4 . Scr tr down 3.14 >2.98 >2.93  . Trend Renal labs    GI # GT feeds   Per nutrition, appreciate care  ENDOCRINE NIDDM Stable, BS <150 , add SSI ad indicated.  Marland Kitchen q4 CBG   HEME Anemia: Likely acute on chronic hbg stable >6.8 >8.9>8.3  (s/p 1 prbc 7/17 ) Heparin for VTE, occult stool neg 7/17  . Daily Protonix   Best practice:  Diet: TF  Pain/Anxiety/Delirium protocol (if indicated): none  VAP protocol (if indicated): yes DVT prophylaxis: SQ heparin, SCDs  GI prophylaxis: Protonix Glucose control: monitor  Mobility: bedrest Code Status: Full  Family Communication: Daughter's Monai Hindes phone number [phone] Disposition: ICU      Niesha Bame NP-C  [facility]  [phone]  04/23/2019   04/23/2019 11:31 AM

## 2019-04-24 ENCOUNTER — Inpatient Hospital Stay (HOSPITAL_COMMUNITY): Payer: Medicare Other

## 2019-04-24 LAB — BASIC METABOLIC PANEL
Anion gap: 9 (ref 5–15)
BUN: 70 mg/dL — ABNORMAL HIGH (ref 8–23)
CO2: 21 mmol/L — ABNORMAL LOW (ref 22–32)
Calcium: 8.3 mg/dL — ABNORMAL LOW (ref 8.9–10.3)
Chloride: 113 mmol/L — ABNORMAL HIGH (ref 98–111)
Creatinine, Ser: 2.78 mg/dL — ABNORMAL HIGH (ref 0.44–1.00)
GFR calc Af Amer: 19 mL/min — ABNORMAL LOW (ref >60)
GFR calc non Af Amer: 16 mL/min — ABNORMAL LOW (ref >60)
Glucose, Bld: 148 mg/dL — ABNORMAL HIGH (ref 70–99)
Potassium: 4.7 mmol/L (ref 3.5–5.1)
Sodium: 143 mmol/L (ref 135–145)

## 2019-04-24 LAB — CBC
HCT: 24.6 % — ABNORMAL LOW (ref 36.0–46.0)
Hemoglobin: 7.8 g/dL — ABNORMAL LOW (ref 12.0–15.0)
MCH: 28.9 pg (ref 26.0–34.0)
MCHC: 31.7 g/dL (ref 30.0–36.0)
MCV: 91.1 fL (ref 80.0–100.0)
Platelets: 123 10*3/uL — ABNORMAL LOW (ref 150–400)
RBC: 2.7 MIL/uL — ABNORMAL LOW (ref 3.87–5.11)
RDW: 17.5 % — ABNORMAL HIGH (ref 11.5–15.5)
WBC: 10.4 10*3/uL (ref 4.0–10.5)
nRBC: 0 % (ref 0.0–0.2)

## 2019-04-24 LAB — GLUCOSE, CAPILLARY
Glucose-Capillary: 130 mg/dL — ABNORMAL HIGH (ref 70–99)
Glucose-Capillary: 138 mg/dL — ABNORMAL HIGH (ref 70–99)
Glucose-Capillary: 142 mg/dL — ABNORMAL HIGH (ref 70–99)
Glucose-Capillary: 150 mg/dL — ABNORMAL HIGH (ref 70–99)
Glucose-Capillary: 153 mg/dL — ABNORMAL HIGH (ref 70–99)
Glucose-Capillary: 165 mg/dL — ABNORMAL HIGH (ref 70–99)
Glucose-Capillary: 171 mg/dL — ABNORMAL HIGH (ref 70–99)

## 2019-04-24 LAB — HEMOGLOBIN A1C
Hgb A1c MFr Bld: 5.6 % (ref 4.8–5.6)
Mean Plasma Glucose: 114.02 mg/dL

## 2019-04-24 MED ORDER — GABAPENTIN 300 MG/6ML PO SOLN
300.0000 mg | Freq: Every day | ORAL | Status: DC
  Administered 2019-04-25 – 2019-04-30 (×6): 300 mg
  Filled 2019-04-24 (×8): qty 6

## 2019-04-24 MED ORDER — INSULIN ASPART 100 UNIT/ML ~~LOC~~ SOLN
0.0000 [IU] | SUBCUTANEOUS | Status: DC
  Administered 2019-04-24: 1 [IU] via SUBCUTANEOUS
  Administered 2019-04-24 (×2): 2 [IU] via SUBCUTANEOUS
  Administered 2019-04-24 – 2019-04-25 (×5): 1 [IU] via SUBCUTANEOUS
  Administered 2019-04-25 (×2): 2 [IU] via SUBCUTANEOUS
  Administered 2019-04-25 – 2019-04-26 (×3): 1 [IU] via SUBCUTANEOUS
  Administered 2019-04-26: 2 [IU] via SUBCUTANEOUS
  Administered 2019-04-26 (×2): 1 [IU] via SUBCUTANEOUS
  Administered 2019-04-26: 2 [IU] via SUBCUTANEOUS
  Administered 2019-04-27 (×3): 1 [IU] via SUBCUTANEOUS
  Administered 2019-04-27: 2 [IU] via SUBCUTANEOUS
  Administered 2019-04-27 – 2019-04-28 (×3): 1 [IU] via SUBCUTANEOUS

## 2019-04-24 MED ORDER — SODIUM CHLORIDE 0.9 % IV SOLN
500.0000 mg | Freq: Two times a day (BID) | INTRAVENOUS | Status: AC
  Administered 2019-04-24 – 2019-04-26 (×5): 500 mg via INTRAVENOUS
  Filled 2019-04-24 (×5): qty 0.5

## 2019-04-24 NOTE — Progress Notes (Signed)
eLink Physician-Brief Progress Note Patient Name: Nancy Mullins DOB: [birth year] MRN: [id number]   Date of Service  04/24/2019  HPI/Events of Note  Hyperglycemia - Blood glucose = 171.   eICU Interventions  Will order: 1. Q 4 hour sensitive Novolog SSI.     Intervention Category Major Interventions: Hyperglycemia - active titration of insulin therapy  S[place]mer,Steven Cornelia Copa 04/24/2019, 12:57 AM

## 2019-04-24 NOTE — Progress Notes (Addendum)
Name: Nancy Mullins  MRN: [id number] DOB: 1946/07/01 LOS: 6   ADMISSION TIME: 04/18/2019  8:37 PM CONSULTATION DATE: 04/18/19  REFERRING MD: OSH [place] CHIEF COMPLAINT: AMS  Brief History   Nancy Mullins is a 73 y.o. female with PMHx s/f NIDDM, hypothyroidism, CKD, CHF, GERD, HLD  Who presented from [facility] in [place] as transfer.  [facility] from [facility] (nursing home 2 to 3 months) where she had a son in change in [place] on 04/18/2019 a.m.  Per EMS, baseline is ANO x4.   On arrival to [facility], patient was more lethargic and somnolent from earlier in the day and A& O to self only.  Patient declined post arrival and was intubated for airway protection followed by hypotension.  [name], PCCM accepted transfer at 38  Past Medical History  Per chart from [facility] 1. HTN 2. HLD 3. ARTHRITIS 4. GERD 5. GOUT 6. DEMENTIA 7. NEPHROLITHIASIS  8. CKD 9. CHF 10. NIDDM w/ Neuropathy 11. HYPERKALEMIA 12. HYPOTHYROID 13. PVD 14. [place] [facility] Events   7/14 - accept transfer & pt admitted to [facility]. Poor mental status, hypotensive (s/p 3.5L cystalloide, albumin x 1, pRBC x 1). Off of all sedation  7/15 - improving BP, poor mental status. EEG w/ generalized slowing. 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 S/p 1 pRBC (6.5>8.5).  Echocardiogram 7/16 -child on PSG/CPAP, period of apnea followed by patient initiated breaths (RR 11 to 13/min) and appropriate volumes.  Slight improvement in AMS   Consults:  Neurology   Procedures:  04/18/19 _0  -- intubated at [facility] [place]) 04/18/19 Central Venous Catheter   Significant Diagnostic Tests:  Significant labs from OSH:  UA: Yellow turbid urine, Spec grav 1.011 ph 5.5 protein 1+ Blood +3 Large Leukocytes negative Nitrites WBC>50 RBC >50 Bacteria 3+ UDS: negative POC GLUCOSE 62  Acetaminophen <1 Salicylate <2 Na 409, K+ 3.8 Cl-  115 HCO3- 23 AG 8 Gluc 113 Calcium 8.1 Albumin 2.2 Tot Protein 5.8 Bilirubin Tot 0.2 BUN 31 Cr 2.98 GFR 19 AST 12 ALT 15 ALK PHOS 81 CKMB 5.2 TSH 1.79  CT w/ at [facility] - no acute process. Chronic changes  CXR at [facility] - hypoinflated lungs with vascualr congestion/edema (disc in chart)  ------------------------------------------------------- [name] 7/15 MRI Brain> no acute finding. Advanced generalized atrophy. 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 Chronic small vessel ischemic changes  7/15 EEG> abnormal EEG with overall slowing, slowing is more prominent over R hemisphere (most notably R temporal region)  7/15 ECHO> Low-normal systolic function, LVEF 81-19%. LV diastolic parameters suggest impaired relaxation. Increased LV end diastolic pressure. CXR> 7/15 ET tube and central line stable. Cardiomegaly with small effusion.   7/17  US renal >> no hnosis  Micro Data:  03/16/19   Had a urine cx from 6/11 that on 6/13 resulted as Ecoli/ESBL (verified by Pharmacy) 04/18/19  Blood cx >> ESBL e coli   MRSA positive    7/14 MC -blood culture x2 >>NEG   7/15 UCx >E Coli -VRE  Antimicrobials:  7/14//20 - ceftriaxone 04/18/19 >> - Meropenem  7/18 Linezolid >>    Interim history/subjective:    Afebrile Remains critically ill, intubated   Objective   BP 128/67   Pulse (!) 52   Temp (!) 97 F (36.1 C) (Bladder)   Resp 14   Ht _1  (1.6 m)   Wt 87.4 kg   SpO2 100%  BMI 34.13 kg/m   Vent Mode: PRVC FiO2 (%):  [30 %] 30 % Set Rate:  [14 bmp] 14 bmp Vt Set:  [410 mL] 410 mL PEEP:  [5 cmH20] 5 cmH20 Pressure Support:  [8 cmH20] 8 cmH20 Plateau Pressure:  [14 cmH20-15 cmH20] 15 cmH20  Intake/Output      07/19 0701 - 07/20 0700 07/20 0701 - 07/21 0700   I.V. (mL/kg) 99.5 (1.1)    NG/GT 1150 200   IV Piggyback 300    Total Intake(mL/kg) 1549.5 (17.7) 200 (2.3)   Urine (mL/kg/hr) 715 (0.3) 50 (0.1)   Stool 0    Total Output 715 50   Net +834.5 +150        Stool Occurrence 3 x      Filed Weights   04/22/19 0500 04/23/19 0500 04/24/19 0359  Weight: 85.9 kg 86 kg 87.4 kg    Physical Exam:  General: lying in bed, with bear hugger.  Elderly, no acute distress HEENT: Mild pallor, no icterus oral ETT  Cardiovascular: RRR, 1+ edema  Respiratory: CTA , vent .  Abdomen: + BS. NT, ND, soft to palpation. TF  Extremities: Warm and well perfused 1+ edema. Integumentary: No obvious rashes, lesions Neuro: followed simple commands but still appears weak and lethargic       Patient Lines/Drains/Airways Status   Active Line/Drains/Airways    Name:   Placement date:   Placement time:   Site:   Days:   Peripheral IV 04/18/19 Right Antecubital   04/18/19    2100    Antecubital   3   Peripheral IV 04/18/19 Right Hand   04/18/19    2100    Hand   3   NG/OG Tube Orogastric Center mouth Xray   04/18/19    2111    Center mouth   3   Urethral Catheter Temperature probe   04/18/19    2100    Temperature probe   3   Airway 7.5 mm   04/18/19    2043     3          Assessment & Plan:   Active Problems:   Acute respiratory failure with hypoxia (HCC)   Encounter for intubation   Septic shock ([place])   Encephalopathy acute   Acute respiratory insufficiency   Endotracheally intubated   Central venous catheter in place  Acute encephalopathy -slowly improving, but still lethargic.  Possibly secondary to sepsis.  EEG - generalized slowing.  MRI neg . 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  Will lower dose of gabapentin, consider decreasing dose of Depakote if remains lethargic  Neuro checks    Acute respiratory failure secondary to Sepsis /acute metabolic encephalopathy  -Wean on pressure support and extubate once fully awake  CHF, stable . Continue tele    ESBL UTI VRE  White count stable.  . Continue meropenem x 8ds . linezolid 7/18  X 5ds, monitor platelets  RENAL AKI? on CKD4 . Scr tr down 3.14 >2.7 .  Trend Renal labs    GI # GT feeds   Per nutrition, appreciate care  NIDDM - SSI as indicated.  Marland Kitchen q4 CBG   HEME Anemia: Likely acute on chronic hbg stable >6.8 >8.9>8.3  (s/p 1 prbc 7/17 ) Heparin for VTE, occult stool neg 7/17  . Daily Protonix   Best practice:  Diet: TF  Pain/Anxiety/Delirium protocol (if indicated): none  VAP protocol (if indicated): yes  DVT prophylaxis: SQ heparin, SCDs  GI prophylaxis: Protonix Glucose control: monitor  Mobility: bedrest Code Status: Full  Family Communication: Daughter's Kaelah Hayashi phone number [phone] Disposition: ICU      The patient is critically ill with multiple organ systems failure and requires high complexity decision making for assessment and support, frequent evaluation and titration of therapies, application of advanced monitoring technologies and extensive interpretation of multiple databases. Critical Care Time devoted to patient care services described in this note independent of APP/resident  time is 31 minutes.   Kara Mead MD. [place]. [facility] Pager [phone] If no response call [phone]    04/24/2019   04/24/2019 1:19 PM

## 2019-04-24 NOTE — Progress Notes (Signed)
Pt's CBG was 171 > 160. Elink made aware. Will continue to assess.

## 2019-04-24 NOTE — Progress Notes (Signed)
Pharmacy Antibiotic Note  Nancy Mullins is a 73 y.o. female admitted on 04/18/2019 with sepsis d/t suspected UTI w/ recent h/o ESBL E.coli.  Pharmacy has been consulted for Merrem dosing.   Micro from OSH: Urine Cx: ESBL E.coli sensitive to meropenem BCx: ESBL E.coli sensitive to meropenem  Micro from [facility]: Urine cx 7/15: 60,000 colonies ESBL E.coli, VRE MRSA PCR 7/14: Positive BC 7/14: ngtd  Patient is on day 6 of meropenem therapy for ESBL in blood and urine, treating for total of 8 days. VRE in urine, treating for total of 5 days with linezolid, stop dates in place   Plan: - Decrease Merrem from 1g to 500mg  IV q12h - Continue linezolid 600mg  IV q12h - Monitor platelets--trending down  Height: 5\' 3"  (160 cm) Weight: 192 lb 10.9 oz (87.4 kg) IBW/kg (Calculated) : 52.4  Temp (24hrs), Avg:98.4 F (36.9 C), Min:97 F (36.1 C), Max:99.3 F (37.4 C)   Allergies  Allergen Reactions  . Benzonatate   . Codeine   . Lithium     Thank you for allowing pharmacy to be a part of this patient's care.  Agnes Lawrence, PharmD PGY1 Pharmacy Resident

## 2019-04-25 LAB — CBC
HCT: 23.7 % — ABNORMAL LOW (ref 36.0–46.0)
HCT: 23.9 % — ABNORMAL LOW (ref 36.0–46.0)
Hemoglobin: 7.5 g/dL — ABNORMAL LOW (ref 12.0–15.0)
Hemoglobin: 7.6 g/dL — ABNORMAL LOW (ref 12.0–15.0)
MCH: 28.6 pg (ref 26.0–34.0)
MCH: 28.9 pg (ref 26.0–34.0)
MCHC: 31.6 g/dL (ref 30.0–36.0)
MCHC: 31.8 g/dL (ref 30.0–36.0)
MCV: 90.5 fL (ref 80.0–100.0)
MCV: 90.9 fL (ref 80.0–100.0)
Platelets: 125 10*3/uL — ABNORMAL LOW (ref 150–400)
Platelets: 138 10*3/uL — ABNORMAL LOW (ref 150–400)
RBC: 2.62 MIL/uL — ABNORMAL LOW (ref 3.87–5.11)
RBC: 2.63 MIL/uL — ABNORMAL LOW (ref 3.87–5.11)
RDW: 17.2 % — ABNORMAL HIGH (ref 11.5–15.5)
RDW: 16.9 % — ABNORMAL HIGH (ref 11.5–15.5)
WBC: 9.5 10*3/uL (ref 4.0–10.5)
WBC: 10.1 10*3/uL (ref 4.0–10.5)
nRBC: 0 % (ref 0.0–0.2)
nRBC: 0 % (ref 0.0–0.2)

## 2019-04-25 LAB — POCT I-STAT 7, (LYTES, BLD GAS, ICA,H+H)
Acid-base deficit: 2 mmol/L (ref 0.0–2.0)
Bicarbonate: 22.7 mmol/L (ref 20.0–28.0)
Calcium, Ion: 1.23 mmol/L (ref 1.15–1.40)
HCT: 26 % — ABNORMAL LOW (ref 36.0–46.0)
Hemoglobin: 8.8 g/dL — ABNORMAL LOW (ref 12.0–15.0)
O2 Saturation: 98 %
Patient temperature: 36.8
Potassium: 4.7 mmol/L (ref 3.5–5.1)
Sodium: 143 mmol/L (ref 135–145)
TCO2: 24 mmol/L (ref 22–32)
pCO2 arterial: 36.7 mmHg (ref 32.0–48.0)
pH, Arterial: 7.399 (ref 7.350–7.450)
pO2, Arterial: 100 mmHg (ref 83.0–108.0)

## 2019-04-25 LAB — BASIC METABOLIC PANEL
Anion gap: 10 (ref 5–15)
BUN: 74 mg/dL — ABNORMAL HIGH (ref 8–23)
CO2: 21 mmol/L — ABNORMAL LOW (ref 22–32)
Calcium: 8.5 mg/dL — ABNORMAL LOW (ref 8.9–10.3)
Chloride: 112 mmol/L — ABNORMAL HIGH (ref 98–111)
Creatinine, Ser: 2.63 mg/dL — ABNORMAL HIGH (ref 0.44–1.00)
GFR calc Af Amer: 20 mL/min — ABNORMAL LOW (ref >60)
GFR calc non Af Amer: 18 mL/min — ABNORMAL LOW (ref >60)
Glucose, Bld: 150 mg/dL — ABNORMAL HIGH (ref 70–99)
Potassium: 4.7 mmol/L (ref 3.5–5.1)
Sodium: 143 mmol/L (ref 135–145)

## 2019-04-25 LAB — GLUCOSE, CAPILLARY
Glucose-Capillary: 134 mg/dL — ABNORMAL HIGH (ref 70–99)
Glucose-Capillary: 123 mg/dL — ABNORMAL HIGH (ref 70–99)
Glucose-Capillary: 161 mg/dL — ABNORMAL HIGH (ref 70–99)
Glucose-Capillary: 138 mg/dL — ABNORMAL HIGH (ref 70–99)
Glucose-Capillary: 144 mg/dL — ABNORMAL HIGH (ref 70–99)

## 2019-04-25 MED ORDER — FUROSEMIDE 10 MG/ML IJ SOLN
40.0000 mg | Freq: Once | INTRAMUSCULAR | Status: AC
  Administered 2019-04-25: 40 mg via INTRAVENOUS
  Filled 2019-04-25: qty 4

## 2019-04-25 NOTE — Progress Notes (Signed)
 Nutrition Follow-up  DOCUMENTATION CODES:   Not applicable  INTERVENTION:   Tube Feeding:  Vital AF 1.2 at 50 ml/hr Pro-Stat 30 mL daily Provides 105 g of protein, 1540 kcals and 972 mL of free water   NUTRITION DIAGNOSIS:   Inadequate oral intake related to acute illness as evidenced by NPO status.  Being addressed via TF  GOAL:   Patient will meet greater than or equal to 90% of their needs  Progressing   MONITOR:   TF tolerance, Vent status, Skin, Labs, Weight trends  REASON FOR ASSESSMENT:   Consult, Ventilator Enteral/tube feeding initiation and management  ASSESSMENT:   73 yo female admitted with AMS, septic shock, acute respiratory failure requiring intubation. PMH includes DM, CHF, CKD IV, GERD, HLD, HTN, dementia  Patient is currently intubated on ventilator support, pt not tolearing weanong MV: 6.1 L/min Temp (24hrs), Avg:97.5 F (36.4 C), Min:96.4 F (35.8 C), Max:98.4 F (36.9 C)  Vital AF 1.2 at 50 ml/hr, Pro-Stat 30 mL daily, free water flush of 100 mL q 8 hours  Pt with generalized 2+ edema present; current wt 88.4 kg, adm wt 80 kg. Net + 8 L since admission   Labs: BUN 74, Creatinine 2.63, CBGs 123-165 Meds: ss novolog  Diet Order:   Diet Order            Diet NPO time specified  Diet effective now              EDUCATION NEEDS:   Not appropriate for education at this time  Skin:  Skin Assessment: Reviewed RN Assessment(no pressure injury noted)  Last BM:  7/21  Height:   Ht Readings from Last 1 Encounters:  04/19/19 5\' 3"  (1.6 m)    Weight:   Wt Readings from Last 1 Encounters:  04/25/19 88.4 kg    Ideal Body Weight:  52.3 kg  BMI:  Body mass index is 34.52 kg/m.  Estimated Nutritional Needs:   Kcal:  1400 kcals  Protein:  100-130 g  Fluid:  >/= 1.5 L     Tijuana Scheidegger MS, RDN, LDN, CNSC [phone] Pager  [phone] Weekend/On-Call Pager

## 2019-04-25 NOTE — Progress Notes (Signed)
Family communication  I have called and spoken with the patient's daughter Nancy Mullins. I have updated her on her mothers condition. I explained that she has not passed the breathing portion of her spontaneous breathing trial today. I explained that we will check a blood gas to see if we can make ventilator changes that may help with weaning success. I explained that we will give her a dose of Lasix today to help optimize her fluid status to give her the best possible chance to wean moving forward. I explained that her lethargy was a continued factor in liberation from  life support, but that this is slowly improving. I confirmed that EEG and MRI brain were unrevealing of cause, and that we will continue to provide supportive care.  I gave Heleeka her mother's requested vital signs. Heleeka verbalized understanding of the above and had no further questions regarding her mother's current condition. I reminded her that she can call the unit for updates as needed.  She was very appreciative of the update.  Magdalen Spatz, AGACNP-BC [place] Pager # [phone] After 4 pm please call [phone]  04/25/2019 1:24 PM

## 2019-04-25 NOTE — Progress Notes (Signed)
Assisted tele visit to patient with daughter, Wille Glaser.  Maryelizabeth Rowan, RN

## 2019-04-25 NOTE — Progress Notes (Signed)
Name: Nancy Mullins  MRN: [id number] DOB: Sep 24, 1946 LOS: 7   ADMISSION TIME: 04/18/2019  8:37 PM CONSULTATION DATE: 04/18/19  REFERRING MD: OSH [place] CHIEF COMPLAINT: AMS  Brief History   Nancy Mullins is a 73 y.o. female with PMHx s/f NIDDM, hypothyroidism, CKD, CHF, GERD, HLD  Who presented from [facility] in [place] as transfer.  [facility] (nursing home 2 to 3 months) where she had a son in change in [place] on 04/18/2019 a.m.  Per EMS, baseline is ANO x4.   On arrival to [facility], patient was more lethargic and somnolent from earlier in the day and A& O to self only.  Patient declined post arrival and was intubated for airway protection followed by hypotension.  [name], PCCM accepted transfer at 64  Past Medical History  Per chart from [facility] 1. HTN 2. HLD 3. ARTHRITIS 4. GERD 5. GOUT 6. DEMENTIA 7. NEPHROLITHIASIS  8. CKD 9. CHF 10. NIDDM w/ Neuropathy 11. HYPERKALEMIA 12. HYPOTHYROID 13. PVD 14. [place] [facility] Events   7/14 - accept transfer & pt admitted to [facility]. Poor mental status, hypotensive (s/p 3.5L cystalloide, albumin x 1, pRBC x 1). Off of all sedation  7/15 - improving BP, poor mental status. EEG w/ generalized slowing. 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 S/p 1 pRBC (6.5>8.5).  Echocardiogram 7/16 -child on PSG/CPAP, period of apnea followed by patient initiated breaths (RR 11 to 13/min) and appropriate volumes.  Slight improvement in AMS   Consults:  Neurology   Procedures:  04/18/19 _0  -- intubated at [facility] [place]) 04/18/19 Central Venous Catheter   Significant Diagnostic Tests:  Significant labs from OSH:  UA: Yellow turbid urine, Spec grav 1.011 ph 5.5 protein 1+ Blood +3 Large Leukocytes negative Nitrites WBC>50 RBC >50 Bacteria 3+ UDS: negative POC GLUCOSE 62  Acetaminophen <1 Salicylate <2 Na 244, K+ 3.8 Cl-  115 HCO3- 23 AG 8 Gluc 113 Calcium 8.1 Albumin 2.2 Tot Protein 5.8 Bilirubin Tot 0.2 BUN 31 Cr 2.98 GFR 19 AST 12 ALT 15 ALK PHOS 81 CKMB 5.2 TSH 1.79  CT w/ at [facility] - no acute process. Chronic changes  CXR at [facility] - hypoinflated lungs with vascualr congestion/edema (disc in chart)  ------------------------------------------------------- [name] 7/15 MRI Brain> no acute finding. Advanced generalized atrophy. 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 Chronic small vessel ischemic changes  7/15 EEG> abnormal EEG with overall slowing, slowing is more prominent over R hemisphere (most notably R temporal region)  7/15 ECHO> Low-normal systolic function, LVEF 01-02%. LV diastolic parameters suggest impaired relaxation. Increased LV end diastolic pressure. CXR> 7/15 ET tube and central line stable. Cardiomegaly with small effusion.  7/17  US renal >> no hnosis  Micro Data:  03/16/19   Had a urine cx from 6/11 that on 6/13 resulted as Ecoli/ESBL (verified by Pharmacy) 04/18/19  Blood cx >> ESBL e coli   MRSA positive    7/14 MC -blood culture x2 >>NEG   7/15 UCx >E Coli -VRE  Antimicrobials:  7/14//20 - ceftriaxone 04/18/19 >> - Meropenem  7/18 Linezolid >>    Interim history/subjective:  Remains afebrile HGB 7.5 Remains somnolent Failed SBT 7/21/am Per Epic she is + 7.6 Liters since admission, , + 1 L  On 7/20 Day 8 ETT  Objective   BP 132/67   Pulse 61   Temp 98.4 F (36.9 C) (Bladder)   Resp 15  Ht 5' 3" (1.6 m)   Wt 88.4 kg   SpO2 100%   BMI 34.52 kg/m   Vent Mode: PRVC FiO2 (%):  [30 %] 30 % Set Rate:  [14 bmp] 14 bmp Vt Set:  [410 mL] 410 mL PEEP:  [5 cmH20] 5 cmH20 Plateau Pressure:  [13 cmH20-16 cmH20] 15 cmH20  Intake/Output      07/20 0701 - 07/21 0700 07/21 0701 - 07/22 0700   I.V. (mL/kg) 99 (1.1)    NG/GT 1350 100   IV Piggyback 555.6 0   Total Intake(mL/kg) 2004.6 (22.7) 100 (1.1)   Urine (mL/kg/hr) 925 (0.4)    Stool 0    Total Output 925    Net +1079.6 +100         Stool Occurrence 3 x     Filed Weights   04/23/19 0500 04/24/19 0359 04/25/19 0359  Weight: 86 kg 87.4 kg 88.4 kg    Physical Exam:  General: supine in bed. Intubated,  no acute distress HEENT: Mild pallor, no icterus oral ETT , swollen lower lip, copious oral secretions, oral ETT secure and intact Cardiovascular:S1, S2,  RRR, No RMG, 1+ edema bilaterally LE Respiratory: Bilateral chest excursion, coarse throughout, on full support at present  Abdomen: + BS. NT, ND, soft to palpation., Obese, tolerating TF  Extremities: Warm and well perfused 1+ BLE edema, no obvious deformities Integumentary: No obvious rashes, lesions, skin is warm dry and intact Neuro: follows simple commands but  appears weak and lethargic,failed SBT 7/21       Patient Lines/Drains/Airways Status   Active Line/Drains/Airways    Name:   Placement date:   Placement time:   Site:   Days:   Peripheral IV 04/18/19 Right Antecubital   04/18/19    2100    Antecubital   3   Peripheral IV 04/18/19 Right Hand   04/18/19    2100    Hand   3   NG/OG Tube Orogastric Center mouth Xray   04/18/19    2111    Center mouth   3   Urethral Catheter Temperature probe   04/18/19    2100    Temperature probe   3   Airway 7.5 mm   04/18/19    2043     3          Assessment & Plan:   Active Problems:   Acute respiratory failure with hypoxia (HCC)   Encounter for intubation   Septic shock ([place])   Encephalopathy acute   Acute respiratory insufficiency   Endotracheally intubated   Central venous catheter in place  Acute encephalopathy -slowly improving, but remains lethargic.   Possibly secondary to sepsis.   EEG - generalized slowing.  MRI neg . 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  Will lower dose of gabapentin, consider decreasing dose of Depakote if remains lethargic  Neuro checks per unit routine  Minimize sedating medications  Light on during the  day, off at bedtime   Acute respiratory failure secondary to Sepsis /acute metabolic encephalopathy  -Wean on pressure support and extubate once fully awake Mental Status is barrier to extubation at present Atelectasis vs infiltrate per CXR Plan - SBT each am - CXR prn - ABG prn - Trend Mag with goal > 2 - Minimize sedation/ sedating medications - Culture sputum as is clinically indicated   CHF, stable + 7.6 L since admission Plan Continue tele Trend BNP prn  Monitor I&O Consider Lasix as renal function allows  Trend CXR    ESBL UTI VRE  White count stable.  . Continue meropenem x 8ds ( Day 7 today) . linezolid 7/18  X 5ds, monitor platelets . Stop Dates are in place . Re-culture urine as is clinically indicated  RENAL AKI? on CKD4 . Scr tr down 3.14 >2.63 . Trend Renal labs  . Avoid nephrotoxic medications . Maintain renal perfusion  . Replete electrolytes as needed    GI # GT feeds  Tolerating well  Per nutrition, appreciate care  NIDDM - SSI as indicated.  Marland Kitchen q4 CBG   HEME Anemia: Likely acute on chronic hbg stable >6.8 >8.9>8.3  (s/p 1 prbc 7/17 ) Heparin for VTE, occult stool neg 7/17  Plan       Trend CBC>> monitor platelets  . Daily Protonix . Monitor for any obvious bleeding . Transfuse for HGB < 7   Best practice:  Diet: TF  Pain/Anxiety/Delirium protocol (if indicated): none  VAP protocol (if indicated): yes DVT prophylaxis: SQ heparin, SCDs  GI prophylaxis: Protonix Glucose control: monitor  Mobility: bedrest Code Status: Full  Family Communication: Daughter's Aaniyah Strohm phone number [phone] Disposition: ICU      Magdalen Spatz, AGACNP-BC [place] Pager # [phone] After 4 pm please call [phone]  04/25/2019   04/25/2019 8:51 AM

## 2019-04-26 ENCOUNTER — Inpatient Hospital Stay (HOSPITAL_COMMUNITY): Payer: Medicare Other

## 2019-04-26 LAB — GLUCOSE, CAPILLARY
Glucose-Capillary: 106 mg/dL — ABNORMAL HIGH (ref 70–99)
Glucose-Capillary: 125 mg/dL — ABNORMAL HIGH (ref 70–99)
Glucose-Capillary: 137 mg/dL — ABNORMAL HIGH (ref 70–99)
Glucose-Capillary: 139 mg/dL — ABNORMAL HIGH (ref 70–99)
Glucose-Capillary: 163 mg/dL — ABNORMAL HIGH (ref 70–99)
Glucose-Capillary: 164 mg/dL — ABNORMAL HIGH (ref 70–99)
Glucose-Capillary: 180 mg/dL — ABNORMAL HIGH (ref 70–99)

## 2019-04-26 LAB — COMPREHENSIVE METABOLIC PANEL
ALT: 58 U/L — ABNORMAL HIGH (ref 0–44)
AST: 84 U/L — ABNORMAL HIGH (ref 15–41)
Albumin: 1.7 g/dL — ABNORMAL LOW (ref 3.5–5.0)
Alkaline Phosphatase: 91 U/L (ref 38–126)
Anion gap: 9 (ref 5–15)
BUN: 76 mg/dL — ABNORMAL HIGH (ref 8–23)
CO2: 22 mmol/L (ref 22–32)
Calcium: 8.7 mg/dL — ABNORMAL LOW (ref 8.9–10.3)
Chloride: 113 mmol/L — ABNORMAL HIGH (ref 98–111)
Creatinine, Ser: 2.48 mg/dL — ABNORMAL HIGH (ref 0.44–1.00)
GFR calc Af Amer: 22 mL/min — ABNORMAL LOW (ref >60)
GFR calc non Af Amer: 19 mL/min — ABNORMAL LOW (ref >60)
Glucose, Bld: 137 mg/dL — ABNORMAL HIGH (ref 70–99)
Potassium: 4.8 mmol/L (ref 3.5–5.1)
Sodium: 144 mmol/L (ref 135–145)
Total Bilirubin: 0.7 mg/dL (ref 0.3–1.2)
Total Protein: 5.4 g/dL — ABNORMAL LOW (ref 6.5–8.1)

## 2019-04-26 LAB — CBC
HCT: 25.4 % — ABNORMAL LOW (ref 36.0–46.0)
Hemoglobin: 7.9 g/dL — ABNORMAL LOW (ref 12.0–15.0)
MCH: 28.8 pg (ref 26.0–34.0)
MCHC: 31.1 g/dL (ref 30.0–36.0)
MCV: 92.7 fL (ref 80.0–100.0)
Platelets: 150 10*3/uL (ref 150–400)
RBC: 2.74 MIL/uL — ABNORMAL LOW (ref 3.87–5.11)
RDW: 16.7 % — ABNORMAL HIGH (ref 11.5–15.5)
WBC: 10.5 10*3/uL (ref 4.0–10.5)
nRBC: 0 % (ref 0.0–0.2)

## 2019-04-26 LAB — BRAIN NATRIURETIC PEPTIDE: B Natriuretic Peptide: 294.3 pg/mL — ABNORMAL HIGH (ref 0.0–100.0)

## 2019-04-26 LAB — MAGNESIUM: Magnesium: 1.8 mg/dL (ref 1.7–2.4)

## 2019-04-26 LAB — PROTIME-INR
INR: 1 (ref 0.8–1.2)
Prothrombin Time: 13 seconds (ref 11.4–15.2)

## 2019-04-26 LAB — APTT: aPTT: 49 seconds — ABNORMAL HIGH (ref 24–36)

## 2019-04-26 MED ORDER — DIVALPROEX SODIUM 125 MG PO CSDR
500.0000 mg | DELAYED_RELEASE_CAPSULE | Freq: Three times a day (TID) | ORAL | Status: DC
  Administered 2019-04-26 – 2019-05-02 (×20): 500 mg via ORAL
  Filled 2019-04-26 (×20): qty 4

## 2019-04-26 MED ORDER — FUROSEMIDE 10 MG/ML IJ SOLN
40.0000 mg | Freq: Two times a day (BID) | INTRAMUSCULAR | Status: DC
  Administered 2019-04-26 – 2019-04-29 (×7): 40 mg via INTRAVENOUS
  Filled 2019-04-26 (×7): qty 4

## 2019-04-26 NOTE — Progress Notes (Signed)
Name: Nancy Mullins  MRN: [id number] DOB: [birth year] LOS: 8   ADMISSION TIME: 04/18/2019  8:37 PM CONSULTATION DATE: 04/18/19  REFERRING MD: OSH [place] CHIEF COMPLAINT: AMS  Brief History   Nancy Mullins is a 73 y.o. female with PMHx s/f NIDDM, hypothyroidism, CKD, CHF, GERD, HLD  Who presented from [facility] in [place] as transfer.  Patient arrived to [place] from [facility] (nursing home 2 to 3 months) where she had a son in change in [place] on 04/18/2019 a.m.  Intubated at OSH for poor mental status transferred to [facility] Developed septic shock secondary to ESBL UTI  Past Medical History  Per chart from [facility] Hypertension, hyperlipidemia, arthritis, GERD, gout, dementia, nephrolithiasis, CKD, CHF, NIDDM with neuropathy, hypokalemia, hypothyroidism, peripheral vascular disease, nephrolithiasis  Significant Hospital Events   7/14 - accept transfer & pt admitted to [facility]. Poor mental status, hypotensive (s/p 3.5L cystalloide, albumin x 1, pRBC x 1). Off of all sedation  7/15 - improving BP, poor mental status. EEG w/ generalized slowing. 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 S/p 1 pRBC (6.5>8.5).  Echocardiogram 7/16 -child on PSG/CPAP, period of apnea followed by patient initiated breaths (RR 11 to 13/min) and appropriate volumes.  Slight improvement in AMS 7/22-slow to improve mental status.  Failing weaning trials   Consults:  Neurology   Procedures:  04/18/19- intubated at [facility] [place], [place]) 04/18/19- Central Venous Catheter   Significant Diagnostic Tests:  Significant labs from OSH:  UA: Yellow turbid urine, Spec grav 1.011 ph 5.5 protein 1+ Blood +3 Large Leukocytes negative Nitrites WBC>50 RBC >50 Bacteria 3+ UDS: negative POC GLUCOSE 62  Acetaminophen <1 Salicylate <2 Na 887, K+ 3.8 Cl- 115 HCO3- 23 AG 8 Gluc 113 Calcium 8.1 Albumin 2.2 Tot Protein 5.8 Bilirubin Tot 0.2 BUN 31 Cr 2.98 GFR 19 AST 12 ALT  15 ALK PHOS 81 CKMB 5.2 TSH 1.79  CT w/ at [facility] - no acute process. Chronic changes  CXR at [facility] - hypoinflated lungs with vascualr congestion/edema (disc in chart)  ------------------------------------------------------- [name] 7/15 MRI Brain> no acute finding. Advanced generalized atrophy. 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 Chronic small vessel ischemic changes  7/15 EEG> abnormal EEG with overall slowing, slowing is more prominent over R hemisphere (most notably R temporal region)  7/15 ECHO> Low-normal systolic function, LVEF 19-59%. LV diastolic parameters suggest impaired relaxation. Increased LV end diastolic pressure. CXR> 7/15 ET tube and central line stable. Cardiomegaly with small effusion.  7/17  US renal >> no hnosis  Micro Data:  03/16/19   Had a urine cx from 6/11 that on 6/13 resulted as Ecoli/ESBL (verified by Pharmacy) 04/18/19  Blood cx >> ESBL e coli   MRSA positive    7/14 MC -blood culture x2 >>NEG   7/15 UCx >E Coli -VRE  Antimicrobials:  7/14 Ceftriaxone 7/14 Meropenem  >> 7/18 Linezolid >>   Interim history/subjective:  Continues on the vent with no acute events. Failing weaning trials  Objective   BP 129/70   Pulse (!) 49   Temp (!) 96.1 F (35.6 C) (Axillary)   Resp 14   Ht 5' 3"  (1.6 m)   Wt 89.4 kg   SpO2 97%   BMI 34.91 kg/m   Vent Mullins: PRVC FiO2 (%):  [30 %] 30 % Set Rate:  [14 bmp] 14 bmp Vt Set:  [410 mL] 410 mL PEEP:  [5 cmH20] 5 cmH20 Plateau Pressure:  [14 cmH20-15 cmH20]  15 cmH20  Intake/Output      07/21 0701 - 07/22 0700 07/22 0701 - 07/23 0700   I.V. (mL/kg)     NG/GT 1405 200   IV Piggyback 700 100   Total Intake(mL/kg) 2105 (23.5) 300 (3.4)   Urine (mL/kg/hr) 1470 (0.7) 100 (0.6)   Stool 0    Total Output 1470 100   Net +635 +200        Stool Occurrence 3 x     Filed Weights   04/24/19 0359 04/25/19 0359 04/26/19 0416  Weight: 87.4 kg 88.4 kg 89.4 kg    Physical Exam:  Gen:      No acute distress HEENT:  EOMI,  sclera anicteric Neck:     No masses; no thyromegaly, ETT Lungs:    Clear to auscultation bilaterally; normal respiratory effort CV:         Regular rate and rhythm; no murmurs Abd:      + bowel sounds; soft, non-tender; no palpable masses, no distension Ext: 1-2+ edema; adequate peripheral perfusion Skin:      Warm and dry; no rash Neuro: Somnolent, awakens to commands.  Assessment & Plan:  73 year old with history of diabetes, CHF, CKD, nursing home resident admitted from outside hospital with altered mental status, septic shock, ESBL UTI  Septic shock.  E. coli, VRE UTI Continue linezolid, Zyvox  Altered mental status. Likely secondary to sepsis Slow improvement in mental status. EEG and MRI are unrevealing.  Neuro signed off Continue supportive care.  Acute respiratory failure On minimal vent settings.  Continue pressure support weans.  Mental status is a barrier to extubation Lasix for diuresis.  Best practice:  Diet: TF  Pain/Anxiety/Delirium protocol (if indicated): none  VAP protocol (if indicated): yes DVT prophylaxis: SQ heparin, SCDs  GI prophylaxis: Protonix Glucose control: monitor  Mobility: bedrest Code Status: Full  Family Communication: Daughter's Adiah Guereca phone number [phone] Disposition: ICU   The patient is critically ill with multiple organ system failure and requires high complexity decision making for assessment and support, frequent evaluation and titration of therapies, advanced monitoring, review of radiographic studies and interpretation of complex data.   Critical Care Time devoted to patient care services, exclusive of separately billable procedures, described in this note is 35 minutes.   Marshell Garfinkel MD [place] [facility] Pager [phone] If no answer call [phone] 04/26/2019, 9:03 AM

## 2019-04-26 NOTE — Progress Notes (Signed)
Pt with Bloody secretions from ETT. E-link notified and STAT labs sent. Hgb dropped from 8.8 to 7.6. INR 1.0. Subq heparin held and E-link notified of results. Will continue to monitor.

## 2019-04-26 NOTE — Progress Notes (Signed)
eLink Physician-Brief Progress Note Patient Name: Nancy Mullins DOB: Feb 24, 1946 MRN: [id number]   Date of Service  04/26/2019  HPI/Events of Note  A 73 year old female admitted to the hospital with acute respiratory failure.  Had a bloody secretions from a endotracheal tube  eICU Interventions  STAT labs sent. Hgb dropped from 8.8 to 7.6. INR 1.0. Subq heparin held.  Continue to monitor.  Ordered SCDs.  Patient hemodynamically stable.      Intervention Category Intermediate Interventions: Bleeding - evaluation and treatment with blood products;Medication change / dose adjustment;Communication with other healthcare providers and/or family  Mady Gemma 04/26/2019, 12:21 AM

## 2019-04-26 NOTE — TOC Initial Note (Signed)
Transition of Care [facility]) - Initial/Assessment Note    Patient Details  Name: Nancy Mullins MRN: [id number] Date of Birth: 11-Jun-1946  Transition of Care [facility]) CM/SW Contact:    Bartholomew Crews, RN Phone Number: [phone] 04/26/2019, 5:42 PM  Clinical Narrative:                 Spoke with ICU nurse today. Patient is weaning better today. PTA was at [facility]. CM to follow for transition of care needs.   Expected Discharge Plan: Skilled Nursing Facility Barriers to Discharge: Continued Medical Work up   Patient Goals and CMS Choice        Expected Discharge Plan and Services Expected Discharge Plan: [place] In-house Referral: Clinical Social Work, Hospice / Palliative Care Discharge Planning Services: CM Consult                                          Prior Living Arrangements/Services                       Activities of Daily Living      Permission Sought/Granted                  Emotional Assessment              Admission diagnosis:  SEPSIS RESP. FAILURE Patient Active Problem List   Diagnosis Date Noted  . Acute respiratory failure with hypoxia ([place]) 04/18/2019  . Encounter for intubation   . Septic shock ([place])   . Encephalopathy acute   . Acute respiratory insufficiency   . Endotracheally intubated   . Central venous catheter in place    PCP:  No primary care provider on file. Pharmacy:  No Pharmacies Listed    Social Determinants of Health (SDOH) Interventions    Readmission Risk Interventions No flowsheet data found.

## 2019-04-27 LAB — BASIC METABOLIC PANEL
Anion gap: 12 (ref 5–15)
BUN: 82 mg/dL — ABNORMAL HIGH (ref 8–23)
CO2: 20 mmol/L — ABNORMAL LOW (ref 22–32)
Calcium: 8.4 mg/dL — ABNORMAL LOW (ref 8.9–10.3)
Chloride: 109 mmol/L (ref 98–111)
Creatinine, Ser: 2.36 mg/dL — ABNORMAL HIGH (ref 0.44–1.00)
GFR calc Af Amer: 23 mL/min — ABNORMAL LOW (ref >60)
GFR calc non Af Amer: 20 mL/min — ABNORMAL LOW (ref >60)
Glucose, Bld: 140 mg/dL — ABNORMAL HIGH (ref 70–99)
Potassium: 5.4 mmol/L — ABNORMAL HIGH (ref 3.5–5.1)
Sodium: 141 mmol/L (ref 135–145)

## 2019-04-27 LAB — CBC
HCT: 23 % — ABNORMAL LOW (ref 36.0–46.0)
Hemoglobin: 7.3 g/dL — ABNORMAL LOW (ref 12.0–15.0)
MCH: 28.9 pg (ref 26.0–34.0)
MCHC: 31.7 g/dL (ref 30.0–36.0)
MCV: 90.9 fL (ref 80.0–100.0)
Platelets: 176 10*3/uL (ref 150–400)
RBC: 2.53 MIL/uL — ABNORMAL LOW (ref 3.87–5.11)
RDW: 16.6 % — ABNORMAL HIGH (ref 11.5–15.5)
WBC: 10.8 10*3/uL — ABNORMAL HIGH (ref 4.0–10.5)
nRBC: 0 % (ref 0.0–0.2)

## 2019-04-27 LAB — POTASSIUM: Potassium: 5.3 mmol/L — ABNORMAL HIGH (ref 3.5–5.1)

## 2019-04-27 LAB — GLUCOSE, CAPILLARY
Glucose-Capillary: 124 mg/dL — ABNORMAL HIGH (ref 70–99)
Glucose-Capillary: 125 mg/dL — ABNORMAL HIGH (ref 70–99)
Glucose-Capillary: 138 mg/dL — ABNORMAL HIGH (ref 70–99)
Glucose-Capillary: 130 mg/dL — ABNORMAL HIGH (ref 70–99)

## 2019-04-27 NOTE — Progress Notes (Signed)
Attempted SBT and wean trial on vent.  Trial was 10-15 minutes in which pt had low VT ( with psv adjustment up to 15), low RR, and low minute volume.  Pt placed back on full vent support.    Also, upon entering room, noted that pt had bloody secretions in suction catheter.  I suctioned pt, and noted pt w/ continued fresh bloody secretions.  RN aware.

## 2019-04-27 NOTE — Progress Notes (Signed)
eLink Physician-Brief Progress Note Patient Name: Nancy Mullins DOB: 06-Jun-1946 MRN: [id number]   Date of Service  04/27/2019  HPI/Events of Note  Pt is intubated on the vent. RN requesting soft restraints for safety.  eICU Interventions  Bilateral wrist soft restraints ordered.        Frederik Pear 04/27/2019, 9:46 PM

## 2019-04-27 NOTE — Progress Notes (Addendum)
Name: Nancy Mullins  MRN: [id number] DOB: 06/19/1946 LOS: 9   ADMISSION TIME: 04/18/2019  8:37 PM CONSULTATION DATE: 04/18/19  REFERRING MD: OSH [place] CHIEF COMPLAINT: AMS  Brief History   Nancy Mullins is a 73 y.o. female with PMHx s/f NIDDM, hypothyroidism, CKD, CHF, GERD, HLD who presented from [facility] in [place] as transfer.  Patient arrived to [facility] from [facility] (nursing home 2 to 3 months) where she had a sudden change in mental status on 04/18/2019.  Intubated at OSH for poor mental status transferred to [facility]. Developed septic shock secondary to ESBL UTI  Past Medical History  Per chart from [place] Hypertension, hyperlipidemia, arthritis, GERD, gout, dementia, nephrolithiasis, CKD, CHF, NIDDM with neuropathy, hypokalemia, hypothyroidism, peripheral vascular disease, nephrolithiasis  Significant Hospital Events   7/14 - accept transfer & pt admitted to [facility]. Poor mental status, hypotensive (s/p 3.5L cystalloide, albumin x 1, pRBC x 1). Off of all sedation  7/15 - improving BP, poor mental status. EEG w/ generalized slowing. 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 S/p 1 pRBC (6.5>8.5).  Echocardiogram 7/16 -trial on PSV/CPAP, period of apnea followed by patient initiated breaths (RR 11 to 13/min) and appropriate volumes.  Slight improvement in AMS 7/22-slow to improve mental status.  Failing weaning trials   Consults:  Neurology   Procedures:  04/18/19- intubated at [facility] [place], [place]) 04/18/19- Central Venous Catheter   Significant Diagnostic Tests:  Significant labs from OSH:  UA: Yellow turbid urine, Spec grav 1.011 ph 5.5 protein 1+ Blood +3 Large Leukocytes negative Nitrites WBC>50 RBC >50 Bacteria 3+ UDS: negative POC GLUCOSE 62  Acetaminophen <1 Salicylate <2 Na 016, K+ 3.8 Cl- 115 HCO3- 23 AG 8 Gluc 113 Calcium 8.1 Albumin 2.2 Tot Protein 5.8 Bilirubin Tot 0.2 BUN 31 Cr 2.98 GFR 19 AST 12  ALT 15 ALK PHOS 81 CKMB 5.2 TSH 1.79  CT w/ at [facility] - no acute process. Chronic changes  CXR at [facility] - hypoinflated lungs with vascualr congestion/edema (disc in chart)  ------------------------------------------------------- [name] 7/15 MRI Brain> no acute finding. Advanced generalized atrophy. 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 Chronic small vessel ischemic changes  7/15 EEG> abnormal EEG with overall slowing, slowing is more prominent over R hemisphere (most notably R temporal region)  7/15 ECHO> Low-normal systolic function, LVEF 55-37%. LV diastolic parameters suggest impaired relaxation. Increased LV end diastolic pressure. CXR> 7/15 ET tube and central line stable. Cardiomegaly with small effusion.  7/17  US renal >> no hydronephrosis  Micro Data:  03/16/19   Urine cx from 6/11 that on 6/13 resulted as Ecoli/ESBL (verified by Pharmacy) 04/18/19  Blood cx >> ESBL e coli   MRSA positive    7/14 MC -blood culture x2 >>NEG   7/15 UCx >E Coli -VRE  Antimicrobials:  7/14 Ceftriaxone x 1 dose OSH 7/14 Meropenem  >> 7/18 Linezolid >>   Interim history/subjective:  More alert, intermittently follows commands.  Failed SBT d/t apnea and low Vt.  Afebrile.  Objective   BP 125/67   Pulse (!) 54   Temp 98.8 F (37.1 C) (Core (Comment))   Resp 14   Ht 5' 3" (1.6 m)   Wt 89.4 kg   SpO2 99%   BMI 34.91 kg/m   Vent Mode: PRVC FiO2 (%):  [30 %] 30 % Set Rate:  [14 bmp] 14 bmp Vt Set:  [410 mL] 410 mL PEEP:  [5 cmH20] 5 cmH20 Pressure Support:  [  Anton Pressure:  [12 cmH20-16 cmH20] 15 cmH20  Intake/Output      07/22 0701 - 07/23 0700 07/23 0701 - 07/24 0700   NG/GT 1160    IV Piggyback 500.1    Total Intake(mL/kg) 1660.1 (18.6)    Urine (mL/kg/hr) 1650 (0.8)    Stool     Total Output 1650    Net +10.1          Filed Weights   04/24/19 0359 04/25/19 0359 04/26/19 0416  Weight: 87.4 kg 88.4 kg 89.4 kg    Physical Exam:  Gen:      Obese, well  developed, moving spontaneously HEENT:  Normocephalic, PERRL. MMM Neck:     No JVD.  Lungs:    Diminished at bases, otherwise clear, FNL, symmetrical. Vent supported CV:         RRR. S1S2. 1/6 murmur. No rub or gallop.  Abd:      +BS x4/ SNT/ND. No masses, guarding or rigidity Ext: 2+ edema to shins, trace edema to hands. MAE well Skin:      PWD. Anterior surfaces in tact.  Neuro: moving head back and forth, opens eyes when name is called, intermittently tracks and intermittently follows commands.  Assessment & Plan:  73 year old with history of diabetes, CHF, CKD, nursing home resident admitted from outside hospital with altered mental status, septic shock, ESBL UTI  Septic shock. Shock resolved. Afebrile. No leukocytosis. VSS. E. coli, VRE UTI Continue linezolid, Zyvox. Will d/w pharmacy duration.   Altered mental status. Likely secondary to sepsis Continues to have Sslow improvement in mental status, more alert today. EEG and MRI are unrevealing.  Neuro signed off Continue supportive care. Encourage sleep/wake cycle Avoid sedation as able   Acute respiratory failure-continues to fail SBT but is more alert today.  Continue pressure support weans and SBT.   Mental status remains barrier to extubation Continue Lasix for diuresis.  CKD with probable AKI on same-presumed stage 3. Crea improving with excellent UO.  Continue to monitor renal function I/O  She is positive 8L and up 9.4kg since admission. Continue to diurese as noted.   Anemia s/p transfusion. H&H Stable. Eliquis on home med list. Not on same since admission Continue to monitor CBC Transfuse for Hgb <7 and/or symptomatic   Best practice:  Diet: TF  Pain/Anxiety/Delirium protocol (if indicated): none  VAP protocol (if indicated): yes DVT prophylaxis: SQ heparin, SCDs  GI prophylaxis: Protonix Glucose control: She is requiring minimal SSI coverage. Continue Mobility: PT Code Status: Full  Family  Communication: Daughter's Christella App phone number [phone] Disposition: ICU    This patient is critically ill with acute respiratory failure requiring intensive care for mechanical ventilator management, advanced and frequent monitoring and interpretation of date.   I spent 35 minutes in direct patient care including reviewing data,  discussing with other providers, assessment, planning and stabilization and documentation. Time is exclusive to this patient and does not include procedures.   Francine Graven, MSN, AGACNP  Pager [phone] or if no answer [phone] [facility] Pulmonary & Critical Care

## 2019-04-27 NOTE — Progress Notes (Signed)
Updated pt's daughter Karys Meckley by phone. Discussed slight improvement in mental status and minimal progress with vent liberation but with hopes that improved mental status may contribute to vent liberation. We discussed alternatives of inability to wean including trach/long term ventilator and feeding. She stated her brother is the pt's POA and they want what is best for their mom but do not want her to suffer long term. They have been in contact with each other regarding this. The pt is wheelchair bound with advanced dementia at baseline and requires assistance with all ADLs. I advised that a family meeting may be warranted in the near future to establish clear goals of care if pt is unable to wean from vent. She acknowledged understanding. All questions answered to her satisfaction. She will call unit later today to arrange a facetime call with her mother.   Francine Graven, MSN, AGACNP  Pager [phone] or if no answer [phone] [facility] Pulmonary & Critical Care

## 2019-04-28 LAB — GLUCOSE, CAPILLARY
Glucose-Capillary: 108 mg/dL — ABNORMAL HIGH (ref 70–99)
Glucose-Capillary: 114 mg/dL — ABNORMAL HIGH (ref 70–99)
Glucose-Capillary: 123 mg/dL — ABNORMAL HIGH (ref 70–99)
Glucose-Capillary: 133 mg/dL — ABNORMAL HIGH (ref 70–99)
Glucose-Capillary: 149 mg/dL — ABNORMAL HIGH (ref 70–99)
Glucose-Capillary: 150 mg/dL — ABNORMAL HIGH (ref 70–99)
Glucose-Capillary: 154 mg/dL — ABNORMAL HIGH (ref 70–99)

## 2019-04-28 LAB — BASIC METABOLIC PANEL
Anion gap: 11 (ref 5–15)
BUN: 83 mg/dL — ABNORMAL HIGH (ref 8–23)
CO2: 22 mmol/L (ref 22–32)
Calcium: 8.5 mg/dL — ABNORMAL LOW (ref 8.9–10.3)
Chloride: 109 mmol/L (ref 98–111)
Creatinine, Ser: 2.38 mg/dL — ABNORMAL HIGH (ref 0.44–1.00)
GFR calc Af Amer: 23 mL/min — ABNORMAL LOW (ref >60)
GFR calc non Af Amer: 20 mL/min — ABNORMAL LOW (ref >60)
Glucose, Bld: 156 mg/dL — ABNORMAL HIGH (ref 70–99)
Potassium: 4.9 mmol/L (ref 3.5–5.1)
Sodium: 142 mmol/L (ref 135–145)

## 2019-04-28 LAB — CBC
HCT: 22.5 % — ABNORMAL LOW (ref 36.0–46.0)
Hemoglobin: 7 g/dL — ABNORMAL LOW (ref 12.0–15.0)
MCH: 28.6 pg (ref 26.0–34.0)
MCHC: 31.1 g/dL (ref 30.0–36.0)
MCV: 91.8 fL (ref 80.0–100.0)
Platelets: 215 10*3/uL (ref 150–400)
RBC: 2.45 MIL/uL — ABNORMAL LOW (ref 3.87–5.11)
RDW: 16.4 % — ABNORMAL HIGH (ref 11.5–15.5)
WBC: 12.1 10*3/uL — ABNORMAL HIGH (ref 4.0–10.5)
nRBC: 0 % (ref 0.0–0.2)

## 2019-04-28 LAB — PROTIME-INR
INR: 1.2 (ref 0.8–1.2)
Prothrombin Time: 14.8 seconds (ref 11.4–15.2)

## 2019-04-28 MED ORDER — METOLAZONE 5 MG PO TABS
5.0000 mg | ORAL_TABLET | Freq: Every day | ORAL | Status: DC
  Administered 2019-04-28 – 2019-04-30 (×3): 5 mg via NASOGASTRIC
  Filled 2019-04-28 (×3): qty 1

## 2019-04-28 MED ORDER — INSULIN ASPART 100 UNIT/ML ~~LOC~~ SOLN
0.0000 [IU] | Freq: Four times a day (QID) | SUBCUTANEOUS | Status: DC
  Administered 2019-04-28: 1 [IU] via SUBCUTANEOUS

## 2019-04-28 MED ORDER — INSULIN ASPART 100 UNIT/ML ~~LOC~~ SOLN
0.0000 [IU] | Freq: Four times a day (QID) | SUBCUTANEOUS | Status: DC
  Administered 2019-04-29 – 2019-05-01 (×9): 2 [IU] via SUBCUTANEOUS
  Administered 2019-05-01: 3 [IU] via SUBCUTANEOUS
  Administered 2019-05-01 – 2019-05-02 (×4): 2 [IU] via SUBCUTANEOUS

## 2019-04-28 NOTE — Progress Notes (Signed)
Assisted tele visit to patient with daughter and family member.  Kwadwo Taras M Judene Logue, RN   

## 2019-04-28 NOTE — Evaluation (Signed)
Physical Therapy Evaluation Patient Details Name: Nancy Mullins MRN: [id number] DOB: 07/21/1946 Today's Date: 04/28/2019   History of Present Illness  Pt adm 7/14 from [place] with AMS, sepsis, and UTI. Pt intubated at [facility] and remains intubated. PMH - DM, HTN, gout, arthritis, obesity, ckd, chf, dementia  Clinical Impression  Pt admitted with above diagnosis and presents to PT with functional limitations due to deficits listed below (See PT problem list). Pt needs skilled PT to maximize independence and safety to allow discharge to SNF.      Follow Up Recommendations SNF    Equipment Recommendations  Other (comment)(Provided by SNF)    Recommendations for Other Services       Precautions / Restrictions Precautions Precautions: Fall Restrictions Weight Bearing Restrictions: No      Mobility  Bed Mobility Overal bed mobility: Needs Assistance Bed Mobility: Supine to Sit;Sit to Supine     Supine to sit: +2 for physical assistance;Total assist Sit to supine: +2 for physical assistance;Total assist   General bed mobility comments: Assist for all aspects. Pt assisting with moving LLE and elevating trunk   Transfers                 General transfer comment: Not attempted  Ambulation/Gait                Stairs            Wheelchair Mobility    Modified Rankin (Stroke Patients Only)       Balance Overall balance assessment: Needs assistance Sitting-balance support: Bilateral upper extremity supported;Feet unsupported Sitting balance-Leahy Scale: Poor Sitting balance - Comments: Sat EOB x 8 minutes with min to mod assist Postural control: Posterior lean                                   Pertinent Vitals/Pain Pain Assessment: Faces Faces Pain Scale: Hurts even more Pain Location: rt foot, knee Pain Descriptors / Indicators: Grimacing;Guarding Pain Intervention(s): Limited activity within patient's tolerance;Monitored  during session;Repositioned    Home Living Family/patient expects to be discharged to:: Skilled nursing facility                      Prior Function Level of Independence: Needs assistance   Gait / Transfers Assistance Needed: Pt nodded yes when asked if she was amb prior to admission.      Comments: Unsure of level due to communication issues due to intubation     Hand Dominance        Extremity/Trunk Assessment   Upper Extremity Assessment Upper Extremity Assessment: Generalized weakness    Lower Extremity Assessment Lower Extremity Assessment: RLE deficits/detail;LLE deficits/detail RLE Deficits / Details: limited by pain in knee/ankle LLE Deficits / Details: Active movement against gravity       Communication   Communication: Other (comment)(Intubated)  Cognition Arousal/Alertness: Awake/alert Behavior During Therapy: WFL for tasks assessed/performed Overall Cognitive Status: Difficult to assess                                 General Comments: Pt nodding yes/no to most questions. Following most 1 step commands      General Comments General comments (skin integrity, edema, etc.): VSS throughout    Exercises     Assessment/Plan    PT Assessment Patient needs continued PT services  PT  Problem List Decreased strength;Decreased activity tolerance;Decreased balance;Decreased mobility;Decreased cognition;Obesity       PT Treatment Interventions DME instruction;Gait training;Functional mobility training;Therapeutic activities;Therapeutic exercise;Balance training;Patient/family education    PT Goals (Current goals can be found in the Care Plan section)  Acute Rehab PT Goals Patient Stated Goal: Pt unable PT Goal Formulation: Patient unable to participate in goal setting Time For Goal Achievement: 05/12/19 Potential to Achieve Goals: Fair    Frequency Min 2X/week   Barriers to discharge        Co-evaluation                AM-PAC PT "6 Clicks" Mobility  Outcome Measure Help needed turning from your back to your side while in a flat bed without using bedrails?: Total Help needed moving from lying on your back to sitting on the side of a flat bed without using bedrails?: Total Help needed moving to and from a bed to a chair (including a wheelchair)?: Total Help needed standing up from a chair using your arms (e.g., wheelchair or bedside chair)?: Total Help needed to walk in hospital room?: Total Help needed climbing 3-5 steps with a railing? : Total 6 Click Score: 6    End of Session Equipment Utilized During Treatment: Oxygen(vent) Activity Tolerance: Patient limited by fatigue Patient left: in bed;with call bell/phone within reach;with bed alarm set Nurse Communication: Mobility status PT Visit Diagnosis: Other abnormalities of gait and mobility (R26.89);Muscle weakness (generalized) (M62.81)    Time: 7948-0165 PT Time Calculation (min) (ACUTE ONLY): 19 min   Charges:   PT Evaluation $PT Eval Moderate Complexity: [place] Pager [phone] Office [place] 04/28/2019, 1:23 PM

## 2019-04-28 NOTE — Progress Notes (Signed)
Updated pt's daughter, Ronee Ranganathan, by phone. Questions answered. Revisited need for discussion of goals of care if unable to liberate from vent. Offered to meet in person is able. She is working this weekend and unable to come to hospital. She will reach out to her brother to see if he is able to come to hospital this weekend to discuss. She is to call unit to arrange facetime call with her mother as she was unable to do so last evening after work. Explained that ICU will attempt to update daily and/or prn.   Francine Graven, MSN, AGACNP  Pager [phone] or if no answer [phone] [facility] Pulmonary & Critical Care

## 2019-04-28 NOTE — Progress Notes (Addendum)
Name: Nancy Mullins  MRN: [id number] DOB: [birth year] LOS: 30   ADMISSION TIME: 04/18/2019  8:37 PM CONSULTATION DATE: 04/18/19  REFERRING MD: OSH Chester CHIEF COMPLAINT: AMS  Brief History   Nancy Mullins is a 73 y.o. female with PMHx NIDDM, hypothyroidism, CKD, CHF, GERD, HLD who presented from [facility] in [place] as transfer.  Patient arrived to [facility] from [facility] (nursing home 2 to 3 months) where she had a sudden change in mental status on 04/18/2019.  Intubated at OSH for poor mental status transferred to [facility]. Developed septic shock secondary to ESBL UTI.  Past Medical History  Per chart from [place] Hypertension, hyperlipidemia, arthritis, GERD, gout, dementia, nephrolithiasis, CKD, CHF, NIDDM with neuropathy, hypokalemia, hypothyroidism, peripheral vascular disease, nephrolithiasis  Significant Hospital Events   7/14 - accept transfer & pt admitted to [facility]. Poor mental status, hypotensive (s/p 3.5L cystalloide, albumin x 1, pRBC x 1). Off of all sedation  7/15 - improving BP, poor mental status. EEG w/ generalized slowing. 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 S/p 1 pRBC (6.5>8.5).  Echocardiogram 7/16 -trial on PSV/CPAP, period of apnea followed by patient initiated breaths (RR 11 to 13/min) and appropriate volumes.  Slight improvement in AMS 7/22slow to improve mental status.  Failing weaning trials   Consults:  Neurology   Procedures:  04/18/19- intubated at [facility] [place], [place]) 04/18/19- Central Venous Catheter   Significant Diagnostic Tests:  Significant labs from OSH:  UA: Yellow turbid urine, Spec grav 1.011 ph 5.5 protein 1+ Blood +3 Large Leukocytes negative Nitrites WBC>50 RBC >50 Bacteria 3+ UDS: negative POC GLUCOSE 62  Acetaminophen <1 Salicylate <2 Na 697, K+ 3.8 Cl- 115 HCO3- 23 AG 8 Gluc 113 Calcium 8.1 Albumin 2.2 Tot Protein 5.8 Bilirubin Tot 0.2 BUN 31 Cr 2.98 GFR 19 AST 12  ALT 15 ALK PHOS 81 CKMB 5.2 TSH 1.79  CT w/ at [facility] - no acute process. Chronic changes  CXR at [facility] - hypoinflated lungs with vascualr congestion/edema (disc in chart)  ------------------------------------------------------- Nancy Mullins 7/15 MRI Brain> no acute finding. Advanced generalized atrophy. 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 Chronic small vessel ischemic changes  7/15 EEG> abnormal EEG with overall slowing, slowing is more prominent over R hemisphere (most notably R temporal region)  7/15 ECHO> Low-normal systolic function, LVEF 94-80%. LV diastolic parameters suggest impaired relaxation. Increased LV end diastolic pressure. CXR> 7/15 ET tube and central line stable. Cardiomegaly with small effusion.  7/17  US renal >> no hydronephrosis   Micro Data:  03/16/19   Urine cx from 6/11 that on 6/13 resulted as Ecoli/ESBL (verified by Pharmacy) 04/18/19  Blood cx >> ESBL e coli   MRSA positive    7/14 MC -blood culture x2 >>NEG   7/15 UCx >E Coli -VRE  Antimicrobials:  7/14 Ceftriaxone x 1 dose OSH 7/14 Meropenem  >> 7/22 7/18 Linezolid >> 7/23  Interim history/subjective:  More alert, following commands in upper EXT. Wants hand mitts off. On PSV 12/5 with poor Vt and apneic periods.   Objective   BP 135/61   Pulse 72   Temp 99.5 F (37.5 C) (Bladder)   Resp 17   Ht 5' 3" (1.6 m)   Wt 90.6 kg   SpO2 98%   BMI 35.38 kg/m   Vent Mode: PSV;CPAP FiO2 (%):  [30 %] 30 % Set Rate:  [14 bmp] 14 bmp Vt Set:  [410 mL] 410 mL PEEP:  [5  cmH20] 5 cmH20 Pressure Support:  [10 ION62-95 cmH20] 12 cmH20 Plateau Pressure:  [10 cmH20-15 cmH20] 10 cmH20  Intake/Output      07/23 0701 - 07/24 0700 07/24 0701 - 07/25 0700   NG/GT 1535    IV Piggyback     Total Intake(mL/kg) 1535 (16.9)    Urine (mL/kg/hr) 1941 (0.9)    Stool 50    Total Output 1991    Net -456          Filed Weights   04/25/19 0359 04/26/19 0416 04/28/19 0422  Weight: 88.4 kg 89.4 kg 90.6 kg    Physical Exam:   Gen:      Obese, well developed, sitting up in bed, looking around HEENT:  Normocephalic, PERRL. MMM Neck:     No JVD.  Lungs:    Diminished at bases, otherwise clear, FNL, symmetrical. Vent supported CV:         RRR. S1S2. 1/6 murmur. No rub or gallop.  Abd:      +BS x4/ SNT/ND. No masses, guarding or rigidity Ext: 2+ edema to shins, 3+ edema L foot, trace edema to hands. MAE well Skin:      PWD. Anterior surfaces in tact.  Neuro: Eyes open spontaneously, follows commands upper extremities.  Denies pain.  Attempting to pull at hand mittens  Resolved Hospital Problems  Septic shock  Assessment & Plan:  73 year old with history of diabetes, CHF, CKD, nursing home resident admitted from outside hospital with altered mental status, septic shock, ESBL UTI  E. coli, VRE UTI.  Slight increase in WBC to 12.1. VSS. Finished course of linezolid, Zyvox.   Altered mental status.  Likely secondary to sepsis Continues to have daily improvement in mental status, more alert today and consistently following commands. EEG and MRI are unrevealing.  Neuro signed off Continue supportive care. Encourage sleep/wake cycle Avoid sedation as able   Acute respiratory failure-periods of apnea and low tidal volumes but tolerating PSV fairly well Continue pressure support weans and SBT.   Continue Lasix for diuresis.  CKD with probable AKI on same-presumed stage 3.  Continues to have excellent urine output however patient progressively uremic with mild hyperkalemia that is improving. Add metolazone Continue to monitor renal function I/O    Anemia s/p transfusion. H&H slowly downtrending. Eliquis on home med list. Not on same since admission. Will hold off as H/H down trend.  Continue to monitor CBC Transfuse for Hgb <7 and/or symptomatic   Best practice:  Diet: TF  Pain/Anxiety/Delirium protocol (if indicated): none  VAP protocol (if indicated): yes DVT prophylaxis: SQ heparin, SCDs  GI prophylaxis:  Protonix Glucose control: She continues to require minimal SSI coverage.  Changed to every 6H. Mobility: PT Code Status: Full  Family Communication: Updated extensively yesterday.  Will contact today.  (Daughter's Alazia Crocket phone number [phone]) Disposition: ICU    This patient is critically ill with acute respiratory failure requiring intensive care for mechanical ventilator management, advanced and frequent monitoring and interpretation of date.   I spent 35 minutes in direct patient care including reviewing data,  discussing with other providers, assessment, planning and stabilization and documentation. Time is exclusive to this patient and does not include procedures.   Francine Graven, MSN, AGACNP  Pager [phone] or if no answer [phone] [facility] Pulmonary & Critical Care

## 2019-04-29 DIAGNOSIS — E875 Hyperkalemia: Secondary | ICD-10-CM

## 2019-04-29 LAB — RENAL FUNCTION PANEL
Albumin: 1.5 g/dL — ABNORMAL LOW (ref 3.5–5.0)
Anion gap: 12 (ref 5–15)
BUN: 86 mg/dL — ABNORMAL HIGH (ref 8–23)
CO2: 21 mmol/L — ABNORMAL LOW (ref 22–32)
Calcium: 8.4 mg/dL — ABNORMAL LOW (ref 8.9–10.3)
Chloride: 108 mmol/L (ref 98–111)
Creatinine, Ser: 2.29 mg/dL — ABNORMAL HIGH (ref 0.44–1.00)
GFR calc Af Amer: 24 mL/min — ABNORMAL LOW (ref >60)
GFR calc non Af Amer: 21 mL/min — ABNORMAL LOW (ref >60)
Glucose, Bld: 181 mg/dL — ABNORMAL HIGH (ref 70–99)
Phosphorus: 5.3 mg/dL — ABNORMAL HIGH (ref 2.5–4.6)
Potassium: 5.3 mmol/L — ABNORMAL HIGH (ref 3.5–5.1)
Sodium: 141 mmol/L (ref 135–145)

## 2019-04-29 LAB — GLUCOSE, CAPILLARY
Glucose-Capillary: 152 mg/dL — ABNORMAL HIGH (ref 70–99)
Glucose-Capillary: 159 mg/dL — ABNORMAL HIGH (ref 70–99)
Glucose-Capillary: 161 mg/dL — ABNORMAL HIGH (ref 70–99)
Glucose-Capillary: 162 mg/dL — ABNORMAL HIGH (ref 70–99)
Glucose-Capillary: 170 mg/dL — ABNORMAL HIGH (ref 70–99)
Glucose-Capillary: 184 mg/dL — ABNORMAL HIGH (ref 70–99)

## 2019-04-29 LAB — CBC
HCT: 22.4 % — ABNORMAL LOW (ref 36.0–46.0)
Hemoglobin: 7 g/dL — ABNORMAL LOW (ref 12.0–15.0)
MCH: 28.7 pg (ref 26.0–34.0)
MCHC: 31.3 g/dL (ref 30.0–36.0)
MCV: 91.8 fL (ref 80.0–100.0)
Platelets: 253 10*3/uL (ref 150–400)
RBC: 2.44 MIL/uL — ABNORMAL LOW (ref 3.87–5.11)
RDW: 16.2 % — ABNORMAL HIGH (ref 11.5–15.5)
WBC: 11.5 10*3/uL — ABNORMAL HIGH (ref 4.0–10.5)
nRBC: 0 % (ref 0.0–0.2)

## 2019-04-29 LAB — MAGNESIUM: Magnesium: 1.7 mg/dL (ref 1.7–2.4)

## 2019-04-29 MED ORDER — FUROSEMIDE 10 MG/ML IJ SOLN
40.0000 mg | Freq: Four times a day (QID) | INTRAMUSCULAR | Status: AC
  Administered 2019-04-29 – 2019-04-30 (×3): 40 mg via INTRAVENOUS
  Filled 2019-04-29 (×3): qty 4

## 2019-04-29 MED ORDER — FUROSEMIDE 10 MG/ML IJ SOLN: 40.0000 mg | Freq: Four times a day (QID) | INTRAMUSCULAR | Status: DC

## 2019-04-29 MED ORDER — FREE WATER
250.0000 mL | Freq: Four times a day (QID) | Status: DC
  Administered 2019-04-29 – 2019-05-02 (×11): 250 mL

## 2019-04-29 NOTE — Progress Notes (Signed)
Name: Nancy Mullins  MRN: [id number] DOB: [birth year] LOS: 73   ADMISSION TIME: 04/18/2019  8:37 PM CONSULTATION DATE: 04/18/19  REFERRING MD: OSH [place] CHIEF COMPLAINT: AMS  Brief History   Nancy Mullins is a 73 y.o. female with PMHx NIDDM, hypothyroidism, CKD, CHF, GERD, HLD who presented from [facility] in [place] as transfer.  Patient arrived to [facility] from [facility] (nursing home 2 to 3 months) where she had a sudden change in mental status on 04/18/2019.  Intubated at OSH for poor mental status transferred to [facility]. Developed septic shock secondary to ESBL UTI.  Past Medical History  Per chart from [place] Hypertension, hyperlipidemia, arthritis, GERD, gout, dementia, nephrolithiasis, CKD, CHF, NIDDM with neuropathy, hypokalemia, hypothyroidism, peripheral vascular disease, nephrolithiasis  Significant Hospital Events   7/14 - accept transfer & pt admitted to [facility]. Poor mental status, hypotensive (s/p 3.5L cystalloide, albumin x 1, pRBC x 1). Off of all sedation  7/15 - improving BP, poor mental status. EEG w/ generalized slowing. 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 S/p 1 pRBC (6.5>8.5).  Echocardiogram 7/16 -trial on PSV/CPAP, period of apnea followed by patient initiated breaths (RR 11 to 13/min) and appropriate volumes.  Slight improvement in AMS 7/22-slow to improve mental status.  Failing weaning trials   Consults:  Neurology   Procedures:  04/18/19- intubated at [facility] [place], [place]) 04/18/19- Central Venous Catheter   Significant Diagnostic Tests:  Significant labs from OSH:  UA: Yellow turbid urine, Spec grav 1.011 ph 5.5 protein 1+ Blood +3 Large Leukocytes negative Nitrites WBC>50 RBC >50 Bacteria 3+ UDS: negative POC GLUCOSE 62  Acetaminophen <1 Salicylate <2 Na 793, K+ 3.8 Cl- 115 HCO3- 23 AG 8 Gluc 113 Calcium 8.1 Albumin 2.2 Tot Protein 5.8 Bilirubin Tot 0.2 BUN 31 Cr 2.98 GFR 19 AST 12  ALT 15 ALK PHOS 81 CKMB 5.2 TSH 1.79  CT w/ at [facility] - no acute process. Chronic changes  CXR at [facility] - hypoinflated lungs with vascualr congestion/edema (disc in chart)  ------------------------------------------------------- [name] 7/15 MRI Brain> no acute finding. Advanced generalized atrophy. 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 Chronic small vessel ischemic changes  7/15 EEG> abnormal EEG with overall slowing, slowing is more prominent over R hemisphere (most notably R temporal region)  7/15 ECHO> Low-normal systolic function, LVEF 90-30%. LV diastolic parameters suggest impaired relaxation. Increased LV end diastolic pressure. CXR> 7/15 ET tube and central line stable. Cardiomegaly with small effusion.  7/17  US renal >> no hydronephrosis   Micro Data:  03/16/19   Urine cx from 6/11 that on 6/13 resulted as Ecoli/ESBL (verified by Pharmacy) 04/18/19  Blood cx >> ESBL e coli   MRSA positive    7/14 MC -blood culture x2 >>NEG   7/15 UCx >E Coli -VRE  Antimicrobials:  7/14 Ceftriaxone x 1 dose OSH 7/14 Meropenem  >> 7/22 7/18 Linezolid >> 7/23  Interim history/subjective:  Waxing and waning mental status.   Objective   BP 133/60   Pulse 67   Temp 98.8 F (37.1 C) (Bladder)   Resp 13   Ht 5' 3"  (1.6 m)   Wt 88.8 kg   SpO2 96%   BMI 34.68 kg/m   Vent Mode: PSV;CPAP FiO2 (%):  [30 %] 30 % Set Rate:  [14 bmp] 14 bmp Vt Set:  [410 mL] 410 mL PEEP:  [5 cmH20] 5 cmH20 Pressure Support:  [12 cmH20] 12 cmH20 Plateau Pressure:  [13 cmH20-15  cmH20] 15 cmH20  Intake/Output      07/24 0701 - 07/25 0700 07/25 0701 - 07/26 0700   I.V. (mL/kg) 20 (0.2)    NG/GT 600 100   Total Intake(mL/kg) 620 (7) 100 (1.1)   Urine (mL/kg/hr) 2100 (1) 150 (0.6)   Stool     Total Output 2100 150   Net -1480 -50         Filed Weights   04/26/19 0416 04/28/19 0422 04/29/19 0408  Weight: 89.4 kg 90.6 kg 88.8 kg    Physical Exam:  Gen:       Chronically ill appearing female, NAD HEENT:  [place]/AT,  PERRL, EOM-I and MMM Neck:      -JVD Lungs:    CTA bilaterally, weaning CV:         RRR, Nl S1/S2 and -M/R/G Abd:      +BS x4/ SNT/ND. No masses, guarding or rigidity Ext:    Diffuse edema Skin:      PWD. Anterior surfaces in tact.  Neuro: Eyes open spontaneously, follows commands upper extremities.  Denies pain.  Attempting to pull at hand mittens  Resolved Hospital Problems  Septic shock  Assessment & Plan:  73 year old with history of diabetes, CHF, CKD, nursing home resident admitted from outside hospital with altered mental status, septic shock, ESBL UTI  E. coli, VRE UTI.  Slight increase in WBC to 12.1. VSS. Finished course of linezolid.   Altered mental status.  Likely secondary to sepsis Continues to have daily improvement in mental status, more alert today and consistently following commands. EEG and MRI are unrevealing.  Neuro signed off Continue supportive care. Encourage sleep/wake cycle D/C all sedation  Acute respiratory failure-periods of apnea and low tidal volumes but tolerating PSV fairly well Dropped to 5/5, will consider extubation if more awake Active diureses as ordered  CKD with probable AKI on same-presumed stage 3.  Continues to have excellent urine output however patient progressively uremic with mild hyperkalemia that is improving. Add metolazone Continue to monitor renal function I/O  Lasix 40 mg IV q6 x3 doses Monitor hyperkalemia for now  Anemia s/p transfusion. H&H slowly downtrending. Eliquis on home med list. Not on same since admission. Will hold off as H/H down trend.  Continue to monitor CBC Transfuse for Hgb <7 and/or symptomatic   Best practice:  Diet: TF  Pain/Anxiety/Delirium protocol (if indicated): none  VAP protocol (if indicated): yes DVT prophylaxis: SQ heparin, SCDs  GI prophylaxis: Protonix Glucose control: She continues to require minimal SSI coverage.  Changed to every 6H. Mobility: PT Code Status: Full  Family  Communication: Updated extensively yesterday.  Will contact today.  (Daughter's Nancy Mullins phone number [phone]) Disposition: ICU   The patient is critically ill with multiple organ systems failure and requires high complexity decision making for assessment and support, frequent evaluation and titration of therapies, application of advanced monitoring technologies and extensive interpretation of multiple databases.   Critical Care Time devoted to patient care services described in this note is  33  Minutes. This time reflects time of care of this signee Dr Jennet Maduro. This critical care time does not reflect procedure time, or teaching time or supervisory time of PA/NP/Med student/Med Resident etc but could involve care discussion time.  Rush Farmer, M.D. [facility] Pulmonary/Critical Care Medicine. Pager: [phone]. After hours pager: [phone].

## 2019-04-30 ENCOUNTER — Inpatient Hospital Stay (HOSPITAL_COMMUNITY): Payer: Medicare Other

## 2019-04-30 LAB — POCT I-STAT 7, (LYTES, BLD GAS, ICA,H+H)
Acid-Base Excess: 2 mmol/L (ref 0.0–2.0)
Bicarbonate: 26.4 mmol/L (ref 20.0–28.0)
Calcium, Ion: 1.23 mmol/L (ref 1.15–1.40)
HCT: 21 % — ABNORMAL LOW (ref 36.0–46.0)
Hemoglobin: 7.1 g/dL — ABNORMAL LOW (ref 12.0–15.0)
O2 Saturation: 98 %
Potassium: 4.6 mmol/L (ref 3.5–5.1)
Sodium: 140 mmol/L (ref 135–145)
TCO2: 28 mmol/L (ref 22–32)
pCO2 arterial: 39.6 mmHg (ref 32.0–48.0)
pH, Arterial: 7.431 (ref 7.350–7.450)
pO2, Arterial: 100 mmHg (ref 83.0–108.0)

## 2019-04-30 LAB — CBC
HCT: 23.2 % — ABNORMAL LOW (ref 36.0–46.0)
Hemoglobin: 7.4 g/dL — ABNORMAL LOW (ref 12.0–15.0)
MCH: 28.8 pg (ref 26.0–34.0)
MCHC: 31.9 g/dL (ref 30.0–36.0)
MCV: 90.3 fL (ref 80.0–100.0)
Platelets: 304 10*3/uL (ref 150–400)
RBC: 2.57 MIL/uL — ABNORMAL LOW (ref 3.87–5.11)
RDW: 16.6 % — ABNORMAL HIGH (ref 11.5–15.5)
WBC: 16.6 10*3/uL — ABNORMAL HIGH (ref 4.0–10.5)
nRBC: 0 % (ref 0.0–0.2)

## 2019-04-30 LAB — RENAL FUNCTION PANEL
Albumin: 1.4 g/dL — ABNORMAL LOW (ref 3.5–5.0)
Anion gap: 15 (ref 5–15)
BUN: 90 mg/dL — ABNORMAL HIGH (ref 8–23)
CO2: 24 mmol/L (ref 22–32)
Calcium: 8.7 mg/dL — ABNORMAL LOW (ref 8.9–10.3)
Chloride: 103 mmol/L (ref 98–111)
Creatinine, Ser: 2.15 mg/dL — ABNORMAL HIGH (ref 0.44–1.00)
GFR calc Af Amer: 26 mL/min — ABNORMAL LOW (ref >60)
GFR calc non Af Amer: 22 mL/min — ABNORMAL LOW (ref >60)
Glucose, Bld: 179 mg/dL — ABNORMAL HIGH (ref 70–99)
Phosphorus: 5.6 mg/dL — ABNORMAL HIGH (ref 2.5–4.6)
Potassium: 4.9 mmol/L (ref 3.5–5.1)
Sodium: 142 mmol/L (ref 135–145)

## 2019-04-30 LAB — GLUCOSE, CAPILLARY
Glucose-Capillary: 160 mg/dL — ABNORMAL HIGH (ref 70–99)
Glucose-Capillary: 188 mg/dL — ABNORMAL HIGH (ref 70–99)
Glucose-Capillary: 176 mg/dL — ABNORMAL HIGH (ref 70–99)

## 2019-04-30 LAB — MAGNESIUM: Magnesium: 1.6 mg/dL — ABNORMAL LOW (ref 1.7–2.4)

## 2019-04-30 MED ORDER — METOLAZONE 5 MG PO TABS: 10.0000 mg | ORAL_TABLET | Freq: Once | ORAL | Status: DC

## 2019-04-30 MED ORDER — MAGNESIUM SULFATE 50 % IJ SOLN: 2.0000 g | Freq: Once | INTRAMUSCULAR | Status: DC

## 2019-04-30 MED ORDER — FUROSEMIDE 10 MG/ML IJ SOLN
40.0000 mg | Freq: Four times a day (QID) | INTRAMUSCULAR | Status: AC
  Administered 2019-04-30 – 2019-05-01 (×3): 40 mg via INTRAVENOUS
  Filled 2019-04-30 (×3): qty 4

## 2019-04-30 MED ORDER — MAGNESIUM SULFATE 2 GM/50ML IV SOLN
2.0000 g | Freq: Once | INTRAVENOUS | Status: AC
  Administered 2019-04-30: 2 g via INTRAVENOUS
  Filled 2019-04-30: qty 50

## 2019-04-30 NOTE — Progress Notes (Signed)
Name: Nancy Mullins  MRN: [id number] DOB: [birth year] LOS: 74   ADMISSION TIME: 04/18/2019  8:37 PM CONSULTATION DATE: 04/18/19  REFERRING MD: OSH [place] CHIEF COMPLAINT: AMS  Brief History   Nancy Mullins is a 73 y.o. female with PMHx NIDDM, hypothyroidism, CKD, CHF, GERD, HLD who presented from [facility] in [place] as transfer.  Patient arrived to [facility] from [facility] (nursing home 2 to 3 months) where she had a sudden change in mental status on 04/18/2019.  Intubated at OSH for poor mental status transferred to [facility]. Developed septic shock secondary to ESBL UTI.  Past Medical History  Per chart from [place] Hypertension, hyperlipidemia, arthritis, GERD, gout, dementia, nephrolithiasis, CKD, CHF, NIDDM with neuropathy, hypokalemia, hypothyroidism, peripheral vascular disease, nephrolithiasis  Significant Hospital Events   7/14 - accept transfer & pt admitted to [facility]. Poor mental status, hypotensive (s/p 3.5L cystalloide, albumin x 1, pRBC x 1). Off of all sedation  7/15 - improving BP, poor mental status. EEG w/ generalized slowing. 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 S/p 1 pRBC (6.5>8.5).  Echocardiogram 7/16 -trial on PSV/CPAP, period of apnea followed by patient initiated breaths (RR 11 to 13/min) and appropriate volumes.  Slight improvement in AMS 7/22-slow to improve mental status.  Failing weaning trials   Consults:  Neurology   Procedures:  04/18/19- intubated at [facility] [place], [place]) 04/18/19- Central Venous Catheter   Significant Diagnostic Tests:  Significant labs from OSH:  UA: Yellow turbid urine, Spec grav 1.011 ph 5.5 protein 1+ Blood +3 Large Leukocytes negative Nitrites WBC>50 RBC >50 Bacteria 3+ UDS: negative POC GLUCOSE 62  Acetaminophen <1 Salicylate <2 Na 353, K+ 3.8 Cl- 115 HCO3- 23 AG 8 Gluc 113 Calcium 8.1 Albumin 2.2 Tot Protein 5.8 Bilirubin Tot 0.2 BUN 31 Cr 2.98 GFR 19 AST 12  ALT 15 ALK PHOS 81 CKMB 5.2 TSH 1.79  CT w/ at [facility] - no acute process. Chronic changes  CXR at [facility] - hypoinflated lungs with vascualr congestion/edema (disc in chart)  ------------------------------------------------------- [name] 7/15 MRI Brain> no acute finding. Advanced generalized atrophy. 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 Chronic small vessel ischemic changes  7/15 EEG> abnormal EEG with overall slowing, slowing is more prominent over R hemisphere (most notably R temporal region)  7/15 ECHO> Low-normal systolic function, LVEF 61-44%. LV diastolic parameters suggest impaired relaxation. Increased LV end diastolic pressure. CXR> 7/15 ET tube and central line stable. Cardiomegaly with small effusion.  7/17  US renal >> no hydronephrosis   Micro Data:  03/16/19   Urine cx from 6/11 that on 6/13 resulted as Ecoli/ESBL (verified by Pharmacy) 04/18/19  Blood cx >> ESBL e coli   MRSA positive    7/14 MC -blood culture x2 >>NEG   7/15 UCx >E Coli -VRE  Antimicrobials:  7/14 Ceftriaxone x 1 dose OSH 7/14 Meropenem  >> 7/22 7/18 Linezolid >> 7/23  Interim history/subjective:  Completely unresponsive this AM  Objective   BP 139/72   Pulse 100   Temp (!) 100.4 F (38 C) (Bladder)   Resp (!) 21   Ht 5' 3" (1.6 m)   Wt 87.5 kg   SpO2 95%   BMI 34.17 kg/m   Vent Mode: PSV;CPAP FiO2 (%):  [30 %] 30 % Set Rate:  [14 bmp] 14 bmp Vt Set:  [410 mL] 410 mL PEEP:  [5 cmH20] 5 cmH20 Pressure Support:  [5 cmH20-10 cmH20] 10 cmH20 Plateau Pressure:  [12  cmH20-14 cmH20] 14 cmH20  Intake/Output      07/25 0701 - 07/26 0700 07/26 0701 - 07/27 0700   I.V. (mL/kg) 230 (2.6)    NG/GT 1400    IV Piggyback 1.6    Total Intake(mL/kg) 1631.6 (18.6)    Urine (mL/kg/hr) 2410 (1.1)    Total Output 2410    Net -778.4          Filed Weights   04/28/19 0422 04/29/19 0408 04/30/19 0349  Weight: 90.6 kg 88.8 kg 87.5 kg    Physical Exam:  Gen:        Acute on chronically ill appearing female,  unresponsive to painful stimuli  HEENT:  [place]/AT, PERRL, EOM-I and MMM Lungs:    CTA bilaterally, weaning CV:         RRR, Nl S1/S2 and -M/R/G Abd:      +BS x4/ SNT/ND. No masses, guarding or rigidity Ext:    Diffuse edema Skin:      PWD. Anterior surfaces in tact.  Neuro:   Not following commands this AM and not arousable to painful stimuli off sedation  Resolved Hospital Problems  Septic shock  Assessment & Plan:  73 year old with history of diabetes, CHF, CKD, nursing home resident admitted from outside hospital with altered mental status, septic shock, ESBL UTI  E. coli, VRE UTI.  Slight increase in WBC to 12.1. VSS. Finished course of linezolid.   Altered mental status.  Likely secondary to sepsis Continues to have daily improvement in mental status, more alert today and consistently following commands. EEG and MRI are unrevealing.  Neuro signed off Continue supportive care. Encourage sleep/wake cycle D/C all sedation Will consult palliative care Doubt will be a trach candidate given advanced dementia  Acute respiratory failure-periods of apnea and low tidal volumes but tolerating PSV fairly well Maintain on PS as tolerated Active diureses as ordered  CKD with probable AKI on same-presumed stage 3.  Continues to have excellent urine output however patient progressively uremic with mild hyperkalemia that is improving. Add metolazone Continue to monitor renal function I/O  Lasix 40 mg IV q6 x3 doses Monitor hyperkalemia for now  Anemia s/p transfusion. H&H slowly downtrending. Eliquis on home med list. Not on same since admission. Will hold off as H/H down trend.  Continue to monitor CBC Transfuse for Hgb <7 and/or symptomatic   Need to contact family on Monday for discussion of plan of care.  Best practice:  Diet: TF  Pain/Anxiety/Delirium protocol (if indicated): none  VAP protocol (if indicated): yes DVT prophylaxis: SQ heparin, SCDs  GI prophylaxis: Protonix  Glucose control: She continues to require minimal SSI coverage.  Changed to every 6H. Mobility: PT Code Status: Full  Family Communication: Updated extensively yesterday.  Will contact today.  (Daughter's Nancy Mullins phone number [phone]) Disposition: ICU   The patient is critically ill with multiple organ systems failure and requires high complexity decision making for assessment and support, frequent evaluation and titration of therapies, application of advanced monitoring technologies and extensive interpretation of multiple databases.   Critical Care Time devoted to patient care services described in this note is  31  Minutes. This time reflects time of care of this signee Dr Jennet Maduro. This critical care time does not reflect procedure time, or teaching time or supervisory time of PA/NP/Med student/Med Resident etc but could involve care discussion time.  Rush Farmer, M.D. [facility] Pulmonary/Critical Care Medicine. Pager: [phone]. After hours pager: [phone].

## 2019-04-30 NOTE — Progress Notes (Signed)
eLink Physician-Brief Progress Note Patient Name: Nancy Mullins DOB: 08-30-1946 MRN: [id number]   Date of Service  04/30/2019  HPI/Events of Note  Mag 1.6  On Lasix   eICU Interventions  2 gm IV mag ordered. K level normal.      Intervention Category Intermediate Interventions: Electrolyte abnormality - evaluation and management  Elmer Sow 04/30/2019, 5:55 AM

## 2019-05-01 ENCOUNTER — Inpatient Hospital Stay (HOSPITAL_COMMUNITY): Payer: Medicare Other

## 2019-05-01 DIAGNOSIS — Z7189 Other specified counseling: Secondary | ICD-10-CM

## 2019-05-01 LAB — POCT I-STAT 7, (LYTES, BLD GAS, ICA,H+H)
Acid-Base Excess: 2 mmol/L (ref 0.0–2.0)
Bicarbonate: 26.5 mmol/L (ref 20.0–28.0)
Calcium, Ion: 1.22 mmol/L (ref 1.15–1.40)
HCT: 23 % — ABNORMAL LOW (ref 36.0–46.0)
Hemoglobin: 7.8 g/dL — ABNORMAL LOW (ref 12.0–15.0)
O2 Saturation: 98 %
Patient temperature: 36.9
Potassium: 4.4 mmol/L (ref 3.5–5.1)
Sodium: 138 mmol/L (ref 135–145)
TCO2: 28 mmol/L (ref 22–32)
pCO2 arterial: 38.2 mmHg (ref 32.0–48.0)
pH, Arterial: 7.449 (ref 7.350–7.450)
pO2, Arterial: 97 mmHg (ref 83.0–108.0)

## 2019-05-01 LAB — RENAL FUNCTION PANEL
Albumin: 1.4 g/dL — ABNORMAL LOW (ref 3.5–5.0)
Anion gap: 14 (ref 5–15)
BUN: 96 mg/dL — ABNORMAL HIGH (ref 8–23)
CO2: 22 mmol/L (ref 22–32)
Calcium: 8.5 mg/dL — ABNORMAL LOW (ref 8.9–10.3)
Chloride: 101 mmol/L (ref 98–111)
Creatinine, Ser: 2.25 mg/dL — ABNORMAL HIGH (ref 0.44–1.00)
GFR calc Af Amer: 25 mL/min — ABNORMAL LOW (ref >60)
GFR calc non Af Amer: 21 mL/min — ABNORMAL LOW (ref >60)
Glucose, Bld: 175 mg/dL — ABNORMAL HIGH (ref 70–99)
Phosphorus: 5.8 mg/dL — ABNORMAL HIGH (ref 2.5–4.6)
Potassium: 5.1 mmol/L (ref 3.5–5.1)
Sodium: 137 mmol/L (ref 135–145)

## 2019-05-01 LAB — MAGNESIUM: Magnesium: 2 mg/dL (ref 1.7–2.4)

## 2019-05-01 LAB — CBC
HCT: 23.4 % — ABNORMAL LOW (ref 36.0–46.0)
Hemoglobin: 7.4 g/dL — ABNORMAL LOW (ref 12.0–15.0)
MCH: 28.9 pg (ref 26.0–34.0)
MCHC: 31.6 g/dL (ref 30.0–36.0)
MCV: 91.4 fL (ref 80.0–100.0)
Platelets: 398 10*3/uL (ref 150–400)
RBC: 2.56 MIL/uL — ABNORMAL LOW (ref 3.87–5.11)
RDW: 16.5 % — ABNORMAL HIGH (ref 11.5–15.5)
WBC: 18.1 10*3/uL — ABNORMAL HIGH (ref 4.0–10.5)
nRBC: 0 % (ref 0.0–0.2)

## 2019-05-01 LAB — GLUCOSE, CAPILLARY
Glucose-Capillary: 171 mg/dL — ABNORMAL HIGH (ref 70–99)
Glucose-Capillary: 176 mg/dL — ABNORMAL HIGH (ref 70–99)
Glucose-Capillary: 180 mg/dL — ABNORMAL HIGH (ref 70–99)
Glucose-Capillary: 206 mg/dL — ABNORMAL HIGH (ref 70–99)

## 2019-05-01 MED ORDER — GABAPENTIN 250 MG/5ML PO SOLN
100.0000 mg | Freq: Every day | ORAL | Status: DC
  Administered 2019-05-01: 100 mg
  Filled 2019-05-01 (×2): qty 2

## 2019-05-01 NOTE — Progress Notes (Addendum)
Name: Nancy Mullins  MRN: [id number] DOB: 1946-06-03 LOS: 20   ADMISSION TIME: 04/18/2019  8:37 PM CONSULTATION DATE: 04/18/19  REFERRING MD: O[facility] [place] CHIEF COMPLAINT: AMS  Brief History   Nancy Mullins is a 73 y.o. female with PMHx NIDDM, hypothyroidism, CKD, CHF, GERD, HLD who presented from [facility] in [place] as transfer.  Patient arrived to [facility] from [facility] (nursing home 2 to 3 months) where she had a sudden change in mental status on 04/18/2019.  Intubated at OSH for poor mental status transferred to [facility]. Developed septic shock secondary to ESBL UTI.  Past Medical History  Per chart from [place] Hypertension, hyperlipidemia, arthritis, GERD, gout, dementia, nephrolithiasis, CKD, CHF, NIDDM with neuropathy, hypokalemia, hypothyroidism, peripheral vascular disease, nephrolithiasis  Significant Hospital Events   7/14 - accept transfer & pt admitted to [facility]. Poor mental status, hypotensive (s/p 3.5L cystalloide, albumin x 1, pRBC x 1). Off of all sedation  7/15 - improving BP, poor mental status. EEG w/ generalized slowing. 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 S/p 1 pRBC (6.5>8.5).  Echocardiogram 7/16 -trial on PSV/CPAP, period of apnea followed by patient initiated breaths (RR 11 to 13/min) and appropriate volumes.  Slight improvement in AMS 7/22-slow to improve mental status.   7/24-Weaning Vent, no improvement in mental status   Consults:  Neurology-singed off   Procedures:  04/18/19- intubated at [facility] [place]) 04/18/19- Central Venous Catheter   Significant Diagnostic Tests:  7/15 MRI Brain> no acute finding. Advanced generalized atrophy. 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 Chronic small vessel ischemic changes  7/15 EEG> abnormal EEG with overall slowing, slowing is more prominent over R hemisphere (most notably R temporal region)  7/15 ECHO> Low-normal systolic function, LVEF 83-15%. LV  diastolic parameters suggest impaired relaxation. Increased LV end diastolic pressure. CXR> 7/15 ET tube and central line stable. Cardiomegaly with small effusion.  7/17  US renal >> no hydronephrosis 7/24 CXR: atelectasis with possible L basilar infiltrate    Micro Data:  03/16/19   Urine cx from 6/11 that on 6/13 resulted as Ecoli/ESBL (verified by Pharmacy) 04/18/19  Blood cx >> ESBL e coli   MRSA positive    7/14 MC -blood culture x2 >>NEG   7/15 UCx >E Coli -VRE  Antimicrobials:  7/14 Ceftriaxone x 1 dose OSH 7/14 Meropenem  >> 7/15-7/22 7/18 Linezolid >> 7/23  Interim history/subjective:  Completely unresponsive this AM, has some diarrhea and increasing leukocytosis, but no fever.  Objective   BP (!) 145/72   Pulse 92   Temp 99.1 F (37.3 C) (Bladder)   Resp 17   Ht 5\' 3"  (1.6 m)   Wt 86.8 kg   SpO2 94%   BMI 33.90 kg/m   Vent Mode: PSV;CPAP FiO2 (%):  [30 %] 30 % Set Rate:  [14 bmp] 14 bmp Vt Set:  [410 mL] 410 mL PEEP:  [5 cmH20] 5 cmH20 Pressure Support:  [10 cmH20] 10 cmH20 Plateau Pressure:  [14 cmH20-17 cmH20] 14 cmH20  Intake/Output      07/26 0701 - 07/27 0700 07/27 0701 - 07/28 0700   P.O. 3    I.V. (mL/kg) 30 (0.3) 30 (0.3)   NG/GT 960 270   IV Piggyback     Total Intake(mL/kg) 993 (11.4) 300 (3.5)   Urine (mL/kg/hr) 2800 (1.3) 450 (1)   Stool 200 75   Total Output 3000 525   Net -2007 -225  Filed Weights   04/29/19 0408 04/30/19 0349 05/01/19 0449  Weight: 88.8 kg 87.5 kg 86.8 kg    Physical Exam:  Gen:        Acute on chronically ill appearing female, unresponsive to painful stimuli  HEENT:  [place]/AT, PERRL, EOM-I and MMM Lungs:    CTA bilaterally, weaning CV:         RRR, Nl S1/S2 and -M/R/G Abd:      +BS x4/ SNT/ND. No masses, guarding or rigidity Ext:    Diffuse edema Skin:      PWD. Anterior surfaces in tact.  Neuro:   Not following commands this AM and not arousable to painful stimuli off sedation  Resolved Hospital Problems   Septic shock  Assessment & Plan:  73 year old with history of diabetes, CHF, CKD, nursing home resident admitted from outside hospital with altered mental status, septic shock, ESBL UTI, treated with Meropenem.  Sepsis improved, but mental status remains altered.    Septic Shock secondary E. Coli bacteremia, VRE UTI.   P: -Improved, no pressor requirement and treated with a course of Meropenem -WBC increased to 18.1 today, though no fever and vitals are stable -CXR with possible LLL infiltrate, monitor with repeat CXR tomorrow    Altered mental status.  Likely secondary to metabolic process in the setting of sepsis compounded by AKI P: -Not improving off all sedation -Check  Ammonia level,electrolytes and TSH ok -EEG and MRI are unrevealing.  Neuro signed off -Continue home Depakote, reduce Gabapentin from 300mg  qhs to 100mg  qhs -Continue supportive care. -Palliative care was consulted, needs goals of care discussed with family  -Doubt will be a trach candidate given advanced dementia -Now with diarrhea with rectal tube, consider C. Diff testing if pt develops fever  Acute respiratory failure-periods of apnea and low tidal volumes but tolerating PSV fairly well P: -Maintain on PS as tolerated -Continue Diuresis with Metolazone -Monitor CXR for worsening infiltrate   CKD with probable AKI on same-presumed stage 3.  Continues to have excellent urine output however patient progressively uremic with mild hyperkalemia that is improving.  P: -On Metalazone -Renal function stable, continue to monitor renal function -I/O  -Received Lasix 40 mg IV q6 x3 doses -Monitor hyperkalemia for now  Anemia s/p transfusion. H&H slowly downtrending. Eliquis on home med list. Not on same since admission. Will hold off as H/H down trend.  P: -Continue to monitor CBC, Hgb stable -Transfuse for Hgb <7 and/or symptomatic     Best practice:  Diet: TF  Pain/Anxiety/Delirium protocol (if  indicated): none  VAP protocol (if indicated): yes DVT prophylaxis: SQ heparin, SCDs  GI prophylaxis: Protonix Glucose control: She continues to require minimal SSI coverage.  Changed to every 6H. Mobility: PT Code Status: Full  Family Communication: Daughter updated by RN (Daughter's Suezanne Cheshire phone number [phone]) Disposition: ICU   CRITICAL CARE Performed by: Otilio Carpen Gleason  Total critical care time: 32 minutes Critical care was time spent personally by me on the following activities: development of treatment plan with patient and/or surrogate as well as nursing, discussions with consultants, evaluation of patient's response to treatment, examination of patient, obtaining history from patient or surrogate, ordering and performing treatments and interventions, ordering and review of laboratory studies, ordering and review of radiographic studies, pulse oximetry and re-evaluation of patient's condition.   Otilio Carpen Gleason PA-C [phone]  Attending Note:  72 year old female with an extensive PMH who presents to PCCM for AMS and septic shock  from ESBL UTI.  Shock improved but mental status remains very poor.  Remains unresponsive overnight but tolerating PS with clear lungs on exam.  I reviewed CXR myself, ETT is in a good position.  Discussed with PCCM-NP.  Will continue active diureses as ordered.  BMET in AM.  Replace electrolytes.  I spoke with the daughter who is not the power of attorney but she does not feel that the patient would want trach/peg/SNF and would rather be comfortable but her brother is the POA and she would like to speak with him first but he is at work and does not keep a phone with him.  I asked her to have him call me.  Will take tracheostomy off the table at this point given that information and the fact that medically it is inappropriate given mental state and MODS.    The patient is critically ill with multiple organ systems failure and requires high  complexity decision making for assessment and support, frequent evaluation and titration of therapies, application of advanced monitoring technologies and extensive interpretation of multiple databases.   Critical Care Time devoted to patient care services described in this note is  32  Minutes. This time reflects time of care of this signee Dr Jennet Maduro. This critical care time does not reflect procedure time, or teaching time or supervisory time of PA/NP/Med student/Med Resident etc but could involve care discussion time.  Rush Farmer, M.D. [facility] Pulmonary/Critical Care Medicine. Pager: [phone]. After hours pager: [phone].

## 2019-05-01 NOTE — Progress Notes (Signed)
Spoke w/ pts dtr Heleeka and provided updates.  Heleeka appreciative.

## 2019-05-01 NOTE — Progress Notes (Signed)
Spoke w/ pts son who states family wishing to come tonight to see pt.  Relayed that, per Dr. Nelda Marseille, we will transition pt to comfort cares and do terminal extubation tomorrow, however family is welcome to come visit patient tonight if they wish.  Went over visitation guidelines.  Pts son states understanding, appreciative.

## 2019-05-01 NOTE — Progress Notes (Signed)
Son called back.  Patient has already stated that she would not want to be on the ventilator longterm which means not trach/peg/SNF.  They would like to see her prior to transition to comfort care likely tomorrow.  He is to call back and let us know.  In the meantime will proceed with DNR status with no further escalation of care.  Rush Farmer, M.D. [facility] Pulmonary/Critical Care Medicine. Pager: [phone]. After hours pager: [phone].

## 2019-05-01 NOTE — Progress Notes (Signed)
RT note-Patient weaned 10 hours, placed back to full support at this time.

## 2019-05-02 ENCOUNTER — Inpatient Hospital Stay (HOSPITAL_COMMUNITY): Payer: Medicare Other

## 2019-05-02 DIAGNOSIS — Z515 Encounter for palliative care: Secondary | ICD-10-CM

## 2019-05-02 LAB — AMMONIA: Ammonia: 21 umol/L (ref 9–35)

## 2019-05-02 LAB — GLUCOSE, CAPILLARY
Glucose-Capillary: 155 mg/dL — ABNORMAL HIGH (ref 70–99)
Glucose-Capillary: 161 mg/dL — ABNORMAL HIGH (ref 70–99)

## 2019-05-02 MED ORDER — MORPHINE SULFATE (PF) 2 MG/ML IV SOLN: 2.0000 mg | INTRAVENOUS | Status: DC | PRN

## 2019-05-02 MED ORDER — GLYCOPYRROLATE 1 MG PO TABS
1.0000 mg | ORAL_TABLET | ORAL | Status: DC | PRN
  Filled 2019-05-02: qty 1

## 2019-05-02 MED ORDER — MORPHINE BOLUS VIA INFUSION
5.0000 mg | INTRAVENOUS | Status: DC | PRN
  Administered 2019-05-02 – 2019-05-06 (×4): 5 mg via INTRAVENOUS
  Filled 2019-05-02: qty 5

## 2019-05-02 MED ORDER — ACETAMINOPHEN 325 MG PO TABS: 650.0000 mg | ORAL_TABLET | Freq: Four times a day (QID) | ORAL | Status: DC | PRN

## 2019-05-02 MED ORDER — DIPHENHYDRAMINE HCL 50 MG/ML IJ SOLN: 25.0000 mg | INTRAMUSCULAR | Status: DC | PRN

## 2019-05-02 MED ORDER — GLYCOPYRROLATE 0.2 MG/ML IJ SOLN: 0.2000 mg | INTRAMUSCULAR | Status: DC | PRN

## 2019-05-02 MED ORDER — MORPHINE 100MG IN NS 100ML (1MG/ML) PREMIX INFUSION
0.0000 mg/h | INTRAVENOUS | Status: DC
  Administered 2019-05-02 – 2019-05-06 (×6): 5 mg/h via INTRAVENOUS
  Filled 2019-05-02 (×6): qty 100

## 2019-05-02 MED ORDER — DEXTROSE 5 % IV SOLN
INTRAVENOUS | Status: DC
  Administered 2019-05-02: 15:00:00 via INTRAVENOUS

## 2019-05-02 MED ORDER — POLYVINYL ALCOHOL 1.4 % OP SOLN
1.0000 [drp] | Freq: Four times a day (QID) | OPHTHALMIC | Status: DC | PRN
  Filled 2019-05-02: qty 15

## 2019-05-02 MED ORDER — ACETAMINOPHEN 650 MG RE SUPP: 650.0000 mg | Freq: Four times a day (QID) | RECTAL | Status: DC | PRN

## 2019-05-02 NOTE — Progress Notes (Signed)
RT note-Patient extubated to room air, family at the bedside.

## 2019-05-02 NOTE — Progress Notes (Signed)
Name: Nancy Mullins  MRN: [id number] DOB: 1945-11-16 LOS: 36   ADMISSION TIME: 04/18/2019  8:37 PM CONSULTATION DATE: 04/18/19  REFERRING MD: O[facility] [place] CHIEF COMPLAINT: AMS  Brief History   Nancy Mullins is a 73 y.o. female with PMHx NIDDM, hypothyroidism, CKD, CHF, GERD, HLD who presented from [facility] in [place] as transfer.  Patient arrived to [facility] from [facility] (nursing home 2 to 3 months) where she had a sudden change in mental status on 04/18/2019.  Intubated at OSH for poor mental status transferred to [facility]. Developed septic shock secondary to ESBL UTI.  Past Medical History  Per chart from [place] Hypertension, hyperlipidemia, arthritis, GERD, gout, dementia, nephrolithiasis, CKD, CHF, NIDDM with neuropathy, hypokalemia, hypothyroidism, peripheral vascular disease, nephrolithiasis  Significant Hospital Events   7/14 - accept transfer & pt admitted to [facility]. Poor mental status, hypotensive (s/p 3.5L cystalloide, albumin x 1, pRBC x 1). Off of all sedation  7/15 - improving BP, poor mental status. EEG w/ generalized slowing. 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 S/p 1 pRBC (6.5>8.5).  Echocardiogram 7/16 -trial on PSV/CPAP, period of apnea followed by patient initiated breaths (RR 11 to 13/min) and appropriate volumes.  Slight improvement in AMS 7/22-slow to improve mental status.   7/24-Weaning Vent, no improvement in mental status   Consults:  Neurology-singed off   Procedures:  04/18/19- intubated at [facility] [place]) 04/18/19- Central Venous Catheter   Significant Diagnostic Tests:  7/15 MRI Brain> no acute finding. Advanced generalized atrophy. 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 Chronic small vessel ischemic changes  7/15 EEG> abnormal EEG with overall slowing, slowing is more prominent over R hemisphere (most notably R temporal region)  7/15 ECHO> Low-normal systolic function, LVEF 25-36%. LV  diastolic parameters suggest impaired relaxation. Increased LV end diastolic pressure. CXR> 7/15 ET tube and central line stable. Cardiomegaly with small effusion.  7/17  US renal >> no hydronephrosis 7/24 CXR: atelectasis with possible L basilar infiltrate    Micro Data:  03/16/19   Urine cx from 6/11 that on 6/13 resulted as Ecoli/ESBL (verified by Pharmacy) 04/18/19  Blood cx >> ESBL e coli   MRSA positive    7/14 MC -blood culture x2 >>NEG   7/15 UCx >E Coli -VRE  Antimicrobials:  7/14 Ceftriaxone x 1 dose OSH 7/14 Meropenem  >> 7/15-7/22 7/18 Linezolid >> 7/23  Interim history/subjective:  No events overnight, completely unresponsive  Objective   BP 122/60 (BP Location: Right Wrist)   Pulse 67   Temp 100 F (37.8 C) (Bladder)   Resp 15   Ht 5\' 3"  (1.6 m)   Wt 86.8 kg   SpO2 97%   BMI 33.90 kg/m   Vent Mode: PRVC FiO2 (%):  [30 %] 30 % Set Rate:  [14 bmp] 14 bmp Vt Set:  [410 mL] 410 mL PEEP:  [5 cmH20] 5 cmH20 Pressure Support:  [10 cmH20] 10 cmH20 Plateau Pressure:  [16 cmH20-18 cmH20] 18 cmH20  Intake/Output      07/27 0701 - 07/28 0700 07/28 0701 - 07/29 0700   P.O.     I.V. (mL/kg) 200 (2.3)    NG/GT 2215    Total Intake(mL/kg) 2415 (27.8)    Urine (mL/kg/hr) 730 (0.4)    Stool 75    Total Output 805    Net +1610         Urine Occurrence 1 x     [employer]  04/29/19 0408 04/30/19 0349 05/01/19 0449  Weight: 88.8 kg 87.5 kg 86.8 kg    Physical Exam:  Gen:        Chronically ill appearing female, unresponsive HEENT:  [place]/AT, PERRL, EOM-I and MMM, ETT in place Lungs:    CTA bilaterally CV:         RRR, Nl S1/S2 and -M/R/G Abd:       Soft, NT, ND and +BS Ext:     Diffuse anasarca Skin:       Some bruising but intact Neuro:    Essentially comatose  Resolved Hospital Problems  Septic shock  Assessment & Plan:  73 year old with history of diabetes, CHF, CKD, nursing home resident admitted from outside hospital with altered mental status,  septic shock, ESBL UTI, treated with Meropenem.  Sepsis improved, but mental status remains altered.  Spoke with the patient's son and daughter independently.  Son is POA.  They do not wish for trach/peg/SNF as that is against the patient's wishes.  They are to arrive today to proceed to comfort care.  Discussed with RN.  Will continue care for now without escalation.  Place withdrawal orderset on signed and held for RN to release once family arrives and ready will start the process but in the meantime if there are concerns for comfort will start morphine.  The patient is critically ill with multiple organ systems failure and requires high complexity decision making for assessment and support, frequent evaluation and titration of therapies, application of advanced monitoring technologies and extensive interpretation of multiple databases.   Critical Care Time devoted to patient care services described in this note is  32  Minutes. This time reflects time of care of this signee Dr Jennet Maduro. This critical care time does not reflect procedure time, or teaching time or supervisory time of PA/NP/Med student/Med Resident etc but could involve care discussion time.  Rush Farmer, M.D. [facility] Pulmonary/Critical Care Medicine. Pager: [phone]. After hours pager: [phone].

## 2019-05-02 NOTE — Progress Notes (Signed)
Morphine gtt started at 5mg /hr. Pt extubated, does not appear in any distress, family at bedside.

## 2019-05-02 NOTE — Progress Notes (Addendum)
Pts dtr Heleeka called requesting updates. Relayed that pt is resting comfortably in bed, morphine gtt on, will keep family updated as able.  Haleeka appreciative.

## 2019-05-02 NOTE — Progress Notes (Signed)
Physical Therapy Discharge Patient Details Name: Nancy Mullins MRN: [id number] DOB: 1946-09-04 Today's Date: 05/02/2019 Time:  -     Patient discharged from PT services secondary to pt for comfort care.  Please see latest therapy progress note for current level of functioning and progress toward goals.    Progress and discharge plan discussed with patient and/or caregiver: Patient unable to participate in discharge planning and no caregivers available  GP     Shary Decamp [facility] 05/02/2019, 11:59 AM   [place] Pager [phone] Office [phone]

## 2019-05-02 NOTE — Progress Notes (Signed)
Nutrition Brief Note  Chart reviewed. Plan for one-way extubation with comfort care.  No further nutrition interventions planned at this time.  Please re-consult as needed.   [place], [place], [place] Pager [phone] After Hours Pager

## 2019-05-02 NOTE — Progress Notes (Signed)
Spoke w/ pts son Myrna Blazer regarding his plan for today.  Per [place], he will not be coming back to hospital today and feels he has said his goodbyes to his mother. Myrna Blazer states still wants to proceed w/ comfort cares and one way extubation for pt today.  Also spoke w/ pts dtr Heleeka who states her and her niece will be coming to be with patient today.  Explained that once they are here, we will officially move to comfort cares and remove ETT.  Heleeka states understanding, appreciative.

## 2019-05-03 DIAGNOSIS — Z515 Encounter for palliative care: Secondary | ICD-10-CM

## 2019-05-03 MED ORDER — GLYCOPYRROLATE 0.2 MG/ML IJ SOLN
0.4000 mg | Freq: Three times a day (TID) | INTRAMUSCULAR | Status: DC
  Administered 2019-05-03 – 2019-05-05 (×8): 0.4 mg via INTRAVENOUS
  Administered 2019-05-06: 11:00:00 0.2 mg via INTRAVENOUS
  Filled 2019-05-03 (×9): qty 2

## 2019-05-03 MED ORDER — CHLORHEXIDINE GLUCONATE CLOTH 2 % EX PADS
6.0000 | MEDICATED_PAD | Freq: Every day | CUTANEOUS | Status: DC
  Administered 2019-05-04 – 2019-05-06 (×3): 6 via TOPICAL

## 2019-05-03 NOTE — Progress Notes (Signed)
Spoke with [facility], She is going to call the rest of the family to make sure everyone has said their goodbyes and then call this RN back. Patient is resting comfortably with no signs of distress. Morphine drip is going at 5mg /hr.

## 2019-05-03 NOTE — Progress Notes (Addendum)
Name: Nancy Mullins  MRN: [id number] DOB: [birth year] LOS: 14   ADMISSION TIME: 04/18/2019  8:37 PM CONSULTATION DATE: 04/18/19  REFERRING MD: OSH [place] CHIEF COMPLAINT: AMS  Brief History   Nancy Mullins is a 73 y.o. female with PMHx NIDDM, hypothyroidism, CKD, CHF, GERD, HLD who presented from [facility] in [place] as transfer.  Patient arrived to [facility] from [facility] (nursing home 2 to 3 months) where she had a sudden change in mental status on 04/18/2019.  Intubated at OSH for poor mental status transferred to [facility]. Developed septic shock secondary to ESBL UTI.  Past Medical History  Per chart from [place] Hypertension, hyperlipidemia, arthritis, GERD, gout, dementia, nephrolithiasis, CKD, CHF, NIDDM with neuropathy, hypokalemia, hypothyroidism, peripheral vascular disease, nephrolithiasis  Significant Hospital Events   7/14 - accept transfer & pt admitted to [facility]. Poor mental status, hypotensive (s/p 3.5L cystalloide, albumin x 1, pRBC x 1). Off of all sedation  7/15 - improving BP, poor mental status. EEG w/ generalized slowing. 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 S/p 1 pRBC (6.5>8.5).  Echocardiogram 7/16 -trial on PSV/CPAP, period of apnea followed by patient initiated breaths (RR 11 to 13/min) and appropriate volumes.  Slight improvement in AMS 7/22-slow to improve mental status.   7/24-Weaning Vent, no improvement in mental status 7/28: transitioned to comfort care only    Consults:  Neurology-singed off   Procedures:  04/18/19- intubated at [facility] [place]) 04/18/19- Central Venous Catheter   Significant Diagnostic Tests:  7/15 MRI Brain> no acute finding. Advanced generalized atrophy. 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 Chronic small vessel ischemic changes  7/15 EEG> abnormal EEG with overall slowing, slowing is more prominent over R hemisphere (most notably R temporal region)  7/15 ECHO> Low-normal  systolic function, LVEF 66-29%. LV diastolic parameters suggest impaired relaxation. Increased LV end diastolic pressure. CXR> 7/15 ET tube and central line stable. Cardiomegaly with small effusion.  7/17  US renal >> no hydronephrosis 7/24 CXR: atelectasis with possible L basilar infiltrate    Micro Data:  03/16/19   Urine cx from 6/11 that on 6/13 resulted as Ecoli/ESBL (verified by Pharmacy) 7/14/20Blood cx >> ESBL e coli   MRSA positive  7/14 MC -blood culture x2 >>NEG  7/15 UCx >E Coli -VRE  Antimicrobials:  7/14 Ceftriaxone x 1 dose OSH 7/14 Meropenem  >> 7/15-7/22 7/18 Linezolid >> 7/23  Interim history/subjective:  No events overnight.  Pain is unresponsive. Comfortable on morphine infusion.  Objective   BP (!) 115/59   Pulse 66   Temp 98.2 F (36.8 C) (Bladder)   Resp 11   Ht 5\' 3"  (1.6 m)   Wt 86.8 kg   SpO2 93%   BMI 33.90 kg/m   Vent Mode: PRVC FiO2 (%):  [30 %] 30 % Set Rate:  [14 bmp] 14 bmp Vt Set:  [410 mL] 410 mL PEEP:  [5 cmH20] 5 cmH20  Intake/Output      07/28 0701 - 07/29 0700 07/29 0701 - 07/30 0700   I.V. (mL/kg) 396.9 (4.6)    NG/GT 260    Total Intake(mL/kg) 656.9 (7.6)    Urine (mL/kg/hr) 230 (0.1)    Stool 0    Total Output 230    Net +426.9         Urine Occurrence 2 x     Filed Weights   04/29/19 0408 04/30/19 0349 05/01/19 0449  Weight: 88.8 kg 87.5 kg 86.8 kg  Physical Exam:  Gen:        Chronically ill appearing female, unresponsive.  Appears comfortable in bed. HEENT: Normocephalic Lungs:    Equal chest rise/fall Ext:     Diffuse anasarca Skin:       Some bruising but intact Neuro:   Drowsy from noxious stimuli otherwise unresponsive  Resolved Hospital Problems  Septic shock  Assessment & Plan:  73 year old with history of diabetes, CHF, CKD, nursing home resident admitted from outside hospital with altered mental status, septic shock, ESBL UTI, treated with Meropenem.  Sepsis improved, but mental status remained  altered and no signs of improvement.  After extensive discussion with family they opted for comfort measures only.  Same instituted yesterday.   Plan: Continue. Transfer to floor. Family to arrive later today.  Will consult palliative care.  Francine Graven, MSN, AGACNP  Pager [phone] or if no answer [phone] [facility] Pulmonary & Critical Care  Attending Note:  73 year old female with AMS who was extubated now and on comfort measures.  Appears comfortable on exam.  Saturating well.  Suspect will be here for sometime.  Discussed with PCCM-NP and TRH-MD.  Will transfer to palliative and to [facility] with PCCM off 7/30.  Rush Farmer, M.D. [facility] Pulmonary/Critical Care Medicine. Pager: [phone]. After hours pager: [phone].

## 2019-05-03 NOTE — Progress Notes (Signed)
Notified daughter. Heleela Umble of patient's room number and unit phone number.

## 2019-05-03 NOTE — Consult Note (Addendum)
Consultation Note Date: 05/03/2019   Patient Name: Nancy Mullins  DOB: 1946-09-03  MRN: [id number]  Age / Sex: 73 y.o., female  PCP: No primary care provider on file. Referring Physician: Rush Farmer, MD  Reason for Consultation: Establishing goals of care  HPI/Patient Profile: 73 y.o. female  with past medical history of dementia, HTN, HLD, CHF, CKD stage 3, diabetes with neuropathy, PVD admitted on 04/18/2019 with from outside hospital with septic shock and intubated. Sepsis improved but she was unable to successfully wean from ventilator. Family have opted for comfort care over trach/PEG.   Clinical Assessment and Goals of Care: I met today at Ms. Lansing's bedside. She is unresponsive and appears comfortable on morphine infusion. Received report from RN who is currently preparing to move Ms. Mayhall out of ICU to regular room to continue comfort care. RN notes secretions so I will schedule Robinul. Noted that she continues on D5 infusion and RN unclear why this has been continued.   I called and spoke with daughter, Wille Glaser. Heeleka tells me that they are saddened but want their mother to die peacefully and naturally. Their main goal is for comfort. At this time she is comfortable with plan for full comfort care and has no questions or concerns. We did discuss visitation status and that Ms. Schlauch is allowed total 4 visitors (2 at a time) to be with her at the end of her life. Heeleka understands but feels that most family have had the chance to say goodbye but will check in with family members to see if anyone else desires to visit. Contact provided if she or her family or brother have any questions/concerns.    All questions/concerns addressed.   Primary Decision Maker POA son Myrna Blazer? Daughter [facility] listed as emergency contact. They have both been involved in conversation.     SUMMARY OF  RECOMMENDATIONS   - Full comfort care  Code Status/Advance Care Planning:  DNR   Symptom Management:   Robinul scheduled for secretion management.   Morphine infusion for pain/SOB management.   Lorazepam prn seizure/anxiety.   D/C D5 infusion as this is not adding to comfort.   Palliative Prophylaxis:   Aspiration, Frequent Pain Assessment, Oral Care and Turn Reposition  Additional Recommendations (Limitations, Scope, Preferences):  Full Comfort Care  Psycho-social/Spiritual:   Desire for further Chaplaincy support:no  Additional Recommendations: Grief/Bereavement Support  Prognosis:   Hours - Days  Discharge Planning: Hospital death vs hospice facility     Primary Diagnoses: Present on Admission: . Acute respiratory failure with hypoxia ([place])   I have reviewed the medical record, interviewed the patient and family, and examined the patient. The following aspects are pertinent.  No past medical history on file. Social History   Socioeconomic History  . Marital status: Not on file    Spouse name: Not on file  . Number of children: Not on file  . Years of education: Not on file  . Highest education level: Not on file  Occupational History  . Not  on file  Social Needs  . Financial resource strain: Not on file  . Food insecurity    Worry: Not on file    Inability: Not on file  . Transportation needs    Medical: Not on file    Non-medical: Not on file  Tobacco Use  . Smoking status: Not on file  Substance and Sexual Activity  . Alcohol use: Not on file  . Drug use: Not on file  . Sexual activity: Not on file  Lifestyle  . Physical activity    Days per week: Not on file    Minutes per session: Not on file  . Stress: Not on file  Relationships  . Social Herbalist on phone: Not on file    Gets together: Not on file    Attends religious service: Not on file    Active member of club or organization: Not on file    Attends meetings of  clubs or organizations: Not on file    Relationship status: Not on file  Other Topics Concern  . Not on file  Social History Narrative  . Not on file   No family history on file. Scheduled Meds: . Chlorhexidine Gluconate Cloth  6 each Topical Q0600   Continuous Infusions: . sodium chloride Stopped (04/19/19 0436)  . dextrose 20 mL/hr at 05/03/19 0400  . morphine 5 mg/hr (05/03/19 0505)   PRN Meds:.acetaminophen **OR** acetaminophen, diphenhydrAMINE, glycopyrrolate **OR** glycopyrrolate **OR** glycopyrrolate, morphine injection, morphine, polyvinyl alcohol Allergies  Allergen Reactions  . Benzonatate   . Codeine   . Lithium    Review of Systems  Unable to perform ROS: Patient unresponsive    Physical Exam Vitals signs and nursing note reviewed.  Constitutional:      General: She is not in acute distress.    Appearance: She is ill-appearing.  Cardiovascular:     Rate and Rhythm: Normal rate.  Pulmonary:     Comments: Apnea episodes noted; breathes shallow but no distress Abdominal:     Palpations: Abdomen is soft.  Neurological:     Mental Status: She is unresponsive.     Vital Signs: BP (!) 115/59   Pulse 66   Temp 98.2 F (36.8 C) (Bladder)   Resp 11   Ht _0  (1.6 m)   Wt 86.8 kg   SpO2 93%   BMI 33.90 kg/m  Pain Scale: CPOT       SpO2: SpO2: 93 % O2 Device:SpO2: 93 % O2 Flow Rate: .   IO: Intake/output summary:   Intake/Output Summary (Last 24 hours) at 05/03/2019 1250 Last data filed at 05/03/2019 0400 Gross per 24 hour  Intake 446.88 ml  Output 60 ml  Net 386.88 ml    LBM: Last BM Date: 05/01/19 Baseline Weight: Weight: 80 kg Most recent weight: Weight: 86.8 kg     Palliative Assessment/Data:     Time In: 1300 Time Out: 1350 Time Total: 50 min Greater than 50%  of this time was spent counseling and coordinating care related to the above assessment and plan.  Signed by: Vinie Sill, NP Palliative Medicine Team Pager #  [phone] (M-F 8a-5p) Team Phone # [phone] (Nights/Weekends)

## 2019-05-04 DIAGNOSIS — B9629 Other Escherichia coli [E. coli] as the cause of diseases classified elsewhere: Secondary | ICD-10-CM

## 2019-05-04 DIAGNOSIS — Z1612 Extended spectrum beta lactamase (ESBL) resistance: Secondary | ICD-10-CM

## 2019-05-04 NOTE — Progress Notes (Signed)
PROGRESS NOTE    Nancy Mullins  VCB:[id number] DOB: 1945-12-25 DOA: 04/18/2019 PCP: No primary care provider on file.   Brief Narrative:  The patient is a 73 year old morbidly obese African-American female with a past medical history significant for but not limited to non-insulin-dependent diabetes mellitus, hypothyroidism, CKD, CHF, GERD, hyperlipidemia as well as other comorbidities who presented from [facility] in [place] as a transfer.  Patient arrived to [facility] health from the [facility] in [place] where she had a sudden change in mental status on 04/18/2019.  She was intubated at outside hospital for poor mental status and transferred to [facility] for further evaluation.  She subsequently developed septic shock secondary to ESBL UTI.  Neurology was consulted for further evaluation and she had a EEG which showed generalized slowing and an MRI with no acute abnormality.  On 04/20/2019 she was tried on PSVT and CPAP the period of apnea followed by patient initiated breaths and had slight improvement in AMS.  She was slow to improve her mental status and when weaning was inefficient and subsequently because of her poor prognosis and response to interventions she is transition to comfort care only on 05/02/2019 and terminally extubated.  Currently she remains encephalopathic and unresponsive.  Assessment & Plan:   Active Problems:   Acute respiratory failure with hypoxia (HCC)   Encounter for intubation   Septic shock ([place])   Encephalopathy acute   Acute respiratory insufficiency   Endotracheally intubated   Central venous catheter in place   Palliative care encounter   ESBL E. coli, VRE UTI.    Altered mental status. Likely secondary to sepsis   Acute respiratory failure-periods of apnea and low tidal volumes but tolerating PSV fairly well   CKD with probable AKI on same-presumed stage 3.    Anemia s/p transfusion.    The patient is a 73 year old female with a  past medical history significant for diabetes mellitus, CHF, CKD, nursing home resident admitted from outside hospital with altered mental status, septic shock is being UTI treated with meropenem because her mental status remained altered and there is no signs improvement family discussed with the PCCM doctor and they opted for comfort measures only. The patient was transferred to the medical floor after he she was terminally extubated and [facility] picked up the patient on 05/04/2019 and palliative care was consulted.  Expect high risk for decompensation and in hospital death.  All medications not in the patient's comfort have been discontinued and she will be continued on acetaminophen, chlorhexidine, Benadryl, glycopyrrolate, and the morphine drip along with as needed morphine pushes as well as have liquid film tears.   DVT prophylaxis: None the patient is comfort care Code Status: DO NOT RESUSCITATE Family Communication: No family present at bedside  Disposition Plan: Anticipate in hospital death given high risk for decompensation  Consultants:   PCCM Transfer  [place]  Neurology  [place] [facility] Events   7/14 - accept transfer & pt admitted to [facility]. Poor mental status, hypotensive (s/p 3.5L cystalloide, albumin x 1, pRBC x 1). Off of all sedation  7/15 - improving BP, poor mental status. EEG w/ generalized slowing. 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 S/p 1 pRBC (6.5>8.5).  Echocardiogram 7/16 -trial on PSV/CPAP, period of apnea followed by patient initiated breaths (RR 11 to 13/min) and appropriate volumes.  Slight improvement in AMS 7/22-slow to improve mental status.   7/24-Weaning Vent, no improvement in mental status 7/28: transitioned to comfort  care only   Consults:  Neurology-singed off  Procedures:  04/18/19- intubated at [facility] [place]) 04/18/19- Central Venous Catheter  Significant Diagnostic Tests:  7/15 MRI Brain>no  acute finding. Advanced generalized atrophy. 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 Chronic small vessel ischemic changes  7/15 EEG>abnormal EEG with overall slowing, slowing is more prominent over R hemisphere (most notably R temporal region)  7/15 ECHO> Low-normal systolic function, LVEF 37-90%. LV diastolic parameters suggest impaired relaxation. Increased LV end diastolic pressure.CXR> 7/15 ET tube and central line stable. Cardiomegaly with small effusion.  7/17  US renal >> no hydronephrosis 7/24 CXR: atelectasis with possible L basilar infiltrate   Micro Data:  03/16/19              Urine cx from 6/11 that on 6/13 resulted as Ecoli/ESBL (verified by Pharmacy) 7/14/20Blood cx >> ESBL e coli                        MRSA positive  7/14 MC -blood culture x2 >>NEG  7/15 UCx >E Coli -VRE    Antimicrobials:  Anti-infectives (From admission, onward)   Start     Dose/Rate Route Frequency Ordered Stop   04/24/19 1600  meropenem (MERREM) 500 mg in sodium chloride 0.9 % 100 mL IVPB     500 mg 200 mL/hr over 30 Minutes Intravenous Every 12 hours 04/24/19 0854 04/26/19 1716   04/22/19 1200  linezolid (ZYVOX) IVPB 600 mg     600 mg 300 mL/hr over 60 Minutes Intravenous Every 12 hours 04/22/19 1107 04/26/19 2346   04/19/19 0400  meropenem (MERREM) 1 g in sodium chloride 0.9 % 100 mL IVPB  Status:  Discontinued     1 g 200 mL/hr over 30 Minutes Intravenous Every 12 hours 04/19/19 0031 04/24/19 0854     Subjective: Patient is unresponsive and appears comfortable on a morphine drip.  Having short shallow breaths.  Only present at bedside  Objective: Vitals:   05/02/19 1600 05/02/19 1700 05/02/19 1800 05/03/19 1310  BP:    99/60  Pulse: 60 63 66 92  Resp: 12 11 11 12   Temp:    97.8 F (36.6 C)  TempSrc:    Axillary  SpO2: 94% 92% 93% (!) 83%  Weight:      Height:        Intake/Output Summary (Last 24 hours) at 05/04/2019 1202 Last data filed at 05/04/2019 0758 Gross per 24 hour  Intake 0 ml   Output 175 ml  Net -175 ml   Filed Weights   04/29/19 0408 04/30/19 0349 05/01/19 0449  Weight: 88.8 kg 87.5 kg 86.8 kg   Examination: Physical Exam:  Constitutional: WN/WD obese AAF NAD and appears calm and comfortable Eyes: Has eyes closed ENMT: External Ears, Nose appear normal. Poor Dentition  Neck: Appears normal, supple, no cervical masses, normal ROM, no appreciable thyromegaly. No JVD Respiratory: Diminished to auscultation bilaterally, no wheezing, rales, rhonchi or crackles. Slightly increased respiratory effort and patient is not tachypenic. No accessory muscle use but is having short shallow breaths Cardiovascular: RRR, no murmurs / rubs / gallops. S1 and S2 auscultated. No extremity edema.  Abdomen: Soft, non-tender, Distended.  Bowel sounds positive x4.  GU: Deferred. Musculoskeletal: No clubbing / cyanosis of digits/nails. No joint deformity upper and lower extremities.  Skin: No rashes, lesions, ulcers on a limited skin evaluation. No induration; Warm and dry.  Neurologic: Does not respond to physical or verbal stimuli  Psychiatric: Impaired  judgment and insight.  He is not awake or alert and remains unresponsive  Data Reviewed: I have personally reviewed following labs and imaging studies  CBC: Recent Labs  Lab 04/28/19 0131 04/29/19 0346 04/30/19 0328 04/30/19 0452 05/01/19 0329 05/01/19 0641  WBC 12.1* 11.5*  --  16.6*  --  18.1*  HGB 7.0* 7.0* 7.1* 7.4* 7.8* 7.4*  HCT 22.5* 22.4* 21.0* 23.2* 23.0* 23.4*  MCV 91.8 91.8  --  90.3  --  91.4  PLT 215 253  --  304  --  631   Basic Metabolic Panel: Recent Labs  Lab 04/28/19 0131 04/29/19 0346 04/30/19 0328 04/30/19 0452 05/01/19 0329 05/01/19 0641  NA 142 141 140 142 138 137  K 4.9 5.3* 4.6 4.9 4.4 5.1  CL 109 108  --  103  --  101  CO2 22 21*  --  24  --  22  GLUCOSE 156* 181*  --  179*  --  175*  BUN 83* 86*  --  90*  --  96*  CREATININE 2.38* 2.29*  --  2.15*  --  2.25*  CALCIUM 8.5* 8.4*  --   8.7*  --  8.5*  MG  --  1.7  --  1.6*  --  2.0  PHOS  --  5.3*  --  5.6*  --  5.8*   GFR: Estimated Creatinine Clearance: 24 mL/min (A) (by C-G formula based on SCr of 2.25 mg/dL (H)). Liver Function Tests: Recent Labs  Lab 04/29/19 0346 04/30/19 0452 05/01/19 0641  ALBUMIN 1.5* 1.4* 1.4*   No results for input(s): LIPASE, AMYLASE in the last 168 hours. Recent Labs  Lab 05/02/19 0449  AMMONIA 21   Coagulation Profile: Recent Labs  Lab 04/28/19 0131  INR 1.2   Cardiac Enzymes: No results for input(s): CKTOTAL, CKMB, CKMBINDEX, TROPONINI in the last 168 hours. BNP (last 3 results) No results for input(s): PROBNP in the last 8760 hours. HbA1C: No results for input(s): HGBA1C in the last 72 hours. CBG: Recent Labs  Lab 05/01/19 0543 05/01/19 1133 05/01/19 1730 05/01/19 2359 05/02/19 1154  GLUCAP 171* 206* 176* 161* 155*   Lipid Profile: No results for input(s): CHOL, HDL, LDLCALC, TRIG, CHOLHDL, LDLDIRECT in the last 72 hours. Thyroid Function Tests: No results for input(s): TSH, T4TOTAL, FREET4, T3FREE, THYROIDAB in the last 72 hours. Anemia Panel: No results for input(s): VITAMINB12, FOLATE, FERRITIN, TIBC, IRON, RETICCTPCT in the last 72 hours. Sepsis Labs: No results for input(s): PROCALCITON, LATICACIDVEN in the last 168 hours.  No results found for this or any previous visit (from the past 240 hour(s)).   RN Pressure Injury Documentation:     Estimated body mass index is 33.9 kg/m as calculated from the following:   Height as of this encounter: 5\' 3"  (1.6 m).   Weight as of this encounter: 86.8 kg.  Malnutrition Type:  Nutrition Problem: Inadequate oral intake Etiology: acute illness   Malnutrition Characteristics:  Signs/Symptoms: NPO status   Nutrition Interventions:  Interventions: Tube feeding     Radiology Studies: No results found.  Scheduled Meds: . Chlorhexidine Gluconate Cloth  6 each Topical Q0600  . glycopyrrolate  0.4 mg  Intravenous TID   Continuous Infusions: . sodium chloride Stopped (04/19/19 0436)  . morphine 5 mg/hr (05/04/19 0025)    LOS: 16 days   Kerney Elbe, DO [facility] PAGER is on [place]  If 7PM-7AM, please contact night-coverage [email or web address] Password [email or web address] 05/04/2019, 12:02 PM

## 2019-05-05 MED ORDER — LORAZEPAM 2 MG/ML IJ SOLN
1.0000 mg | INTRAMUSCULAR | Status: DC | PRN
  Administered 2019-05-05 – 2019-05-06 (×3): 2 mg via INTRAVENOUS
  Filled 2019-05-05 (×3): qty 1

## 2019-05-05 NOTE — Progress Notes (Signed)
New referral for inpatient hospice.  Referral information obtained and will be reviewed by MD for approval.  Patient currently on a morphine gtt.  We will need to obtain a copy of the morphine gtt if patient is accepted to the hospice home.  Will continue to follow through final disposition.  Thank you for allowing participation in this patient's care.  Dimas Aguas RN [facility] [phone]

## 2019-05-05 NOTE — Social Work (Signed)
CSW spoke with Threasa Beards, after f/u with Marcene Brawn, International aid/development worker for [facility] at [place]. Pt will need to be reviewed by RN and MD and she is working on referral currently. CSW continuing to follow for support with disposition when medically appropriate.  Westley Hummer, MSW, [place] [employer] [phone]

## 2019-05-05 NOTE — Progress Notes (Signed)
Palliative Medicine RN Note: Spoke with RN Mendel Ryder. While pt is comfortable now, she has required two 5mg  bolus doses of morphine (on top of 5mg /hour continuous infusion), and she also needed Ativan prn this morning. PMT is concerned that she is having increased symptoms and would benefit from residential hospice placement for specialized care. Dr Hilma Favors agrees and gave the order for the referral.  I spoke with her daughter Fleet Contras and her [employer]. Both agree to a residential referral to [facility], as they are coming from near [place] asked that we call his sister to discuss things, as he is unable to answer his phone at work.  I updated SW Michiel Cowboy about referral and initiated call to [facility].   Marjie Skiff Tyra Michelle, RN, BSN, [facility] Palliative Medicine Team 05/05/2019 10:49 AM Office [phone]

## 2019-05-05 NOTE — Progress Notes (Signed)
Palliative Medicine RN Note: Checked in with nurse Leatrice Jewels. Patient has required no prn meds and is resting comfortably. Recommend referral to residential hospice if still stable in the morning.   Marjie Skiff Veasna Santibanez, RN, BSN, [facility] Palliative Medicine Team 05/05/2019 10:19 AM Office [phone]

## 2019-05-05 NOTE — Clinical Social Work Note (Addendum)
CSW received a call from Doreatha Lew with [employer] regarding patient. She indicated that patient looks appropriate and is on a morphine drip and they will need a script for the morphine. Marcene Brawn indicated that they can possibly take patient tomorrow and will need script early due to a 4-hour turnaround to get medication. Weekend CSW will be advised.  [place] Stra[place] Givens, MSW, LCSW Licensed Clinical Social Worker [place] [phone]

## 2019-05-05 NOTE — Discharge Summary (Addendum)
Physician Discharge Summary  Nancy Mullins EYC:[id number] DOB: 1945/11/20 DOA: 04/18/2019  PCP: No primary care provider on file.  Admit date: 04/18/2019 Discharge date: 05/29/19  Admitted From: Home Disposition: Residential Hospice  Recommendations for Outpatient Follow-up:  1. Further Care per Hospice Protocol   Home Health: No Equipment/Devices: None   Discharge Condition: Guarded  CODE STATUS: DO NOT RESUSCITATE Diet recommendation: NPO  Brief/Interim Summary: Brief Narrative:  The patient is a 73 year old morbidly obese African-American female with a past medical history significant for but not limited to non-insulin-dependent diabetes mellitus, hypothyroidism, CKD, CHF, GERD, hyperlipidemia as well as other comorbidities who presented from [facility] in [place] as a transfer.  Patient arrived to [facility] health from the [facility] in [place] where she had a sudden change in mental status on 04/18/2019.  She was intubated at outside hospital for poor mental status and transferred to [facility] for further evaluation.  She subsequently developed septic shock secondary to ESBL UTI.  Neurology was consulted for further evaluation and she had a EEG which showed generalized slowing and an MRI with no acute abnormality.  On 04/20/2019 she was tried on PSVT and CPAP the period of apnea followed by patient initiated breaths and had slight improvement in AMS.  She was slow to improve her mental status and when weaning was inefficient and subsequently because of her poor prognosis and lack of response to interventions she was transitioned to comfort care only on 05/02/2019 and terminally extubated.  Currently she remains encephalopathic and unresponsive.  Case was discussed with palliative and family would like the patient go to residential hospice.  Referral is been made and awaiting bed placement.  She is at high risk for decompensation and death is imminent.  Addendum 2019-05-29: Patient  remains about the same as yesterday and still is unresponsive.  She is hemodynamically stable.  Unfortunately she was unable to go to residential hospice yesterday because she was not approved but today she is improved and she is appropriate for them.  There was an issue this morning and concern from Hospice of whether she was stable for transport or not however; patient remained stable about the same and recommendations to transfer to hospice as family will have an easier time for access.  She is actively dying but hemodynamically stable and stable to transport to residential hospice currently with the understanding that she is at high risk for decompensation.  Because the patient seemed to be more pain palliative care change the patient from a Morphine drip to a Dilaudid drip.  No acute events overnight and she will be discharged today  Discharge Diagnoses:  Active Problems:   Acute respiratory failure with hypoxia (HCC)   Encounter for intubation   Septic shock ([place])   Encephalopathy acute   Acute respiratory insufficiency   Endotracheally intubated   Central venous catheter in place   Palliative care encounter   ARF (acute renal failure) ([place])   Infiltrate of lung present on imaging of chest   ESBL E. coli, VRE UTI.   Altered mental status. 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   Anemias/p transfusion.    The patient is a 74 year old female with a past medical history significant for diabetes mellitus, CHF, CKD, nursing home resident admitted from outside hospital with altered mental status, septic shock is being UTI treated with meropenem because her mental  status remained altered and there is no signs improvement family discussed with the PCCM doctor and they opted for comfort measures only. The patient was transferred to the medical floor  after he she was terminally extubated and [facility] picked up the patient on 05/04/2019 and palliative care was consulted. All medications not in the patient's comfort have been discontinued and she will be continued on acetaminophen, chlorhexidine, Benadryl, glycopyrrolate, and the morphine drip along with as needed morphine pushes as well as have liquid film tears.  Morphine drip has now been changed to Dilaudid drip by palliative care medicine. She will benefit from residential hospice and referral has been made and family is in agreement and they both agree to a residential refer to [facility].  She will be discharged when a bed available will and is a high risk for decompensation given that death is imminent.  Discharge Instructions Discharge Instructions    Call MD for:  difficulty breathing, headache or visual disturbances   Complete by: As directed    Call MD for:  extreme fatigue   Complete by: As directed    Call MD for:  hives   Complete by: As directed    Call MD for:  persistant dizziness or light-headedness   Complete by: As directed    Call MD for:  persistant nausea and vomiting   Complete by: As directed    Call MD for:  redness, tenderness, or signs of infection (pain, swelling, redness, odor or green/yellow discharge around incision site)   Complete by: As directed    Call MD for:  severe uncontrolled pain   Complete by: As directed    Call MD for:  temperature >100.4   Complete by: As directed    Diet - low sodium heart healthy   Complete by: As directed    Discharge instructions   Complete by: As directed    Further Care per Hospice Protocol   Increase activity slowly   Complete by: As directed      Allergies as of 05/06/2019      Reactions   Benzonatate    Codeine    Lithium       Medication List    STOP taking these medications   acetaminophen 500 MG tablet Commonly known as: TYLENOL   amLODipine 10 MG tablet Commonly known as: NORVASC    atorvastatin 80 MG tablet Commonly known as: LIPITOR   bisacodyl 10 MG suppository Commonly known as: DULCOLAX   diclofenac sodium 1 % Gel Commonly known as: VOLTAREN   divalproex 250 MG DR tablet Commonly known as: DEPAKOTE   Eliquis 5 MG Tabs tablet Generic drug: apixaban   ferrous sulfate 325 (65 FE) MG tablet   folic acid 1 MG tablet Commonly known as: FOLVITE   hydrALAZINE 10 MG tablet Commonly known as: APRESOLINE   isosorbide dinitrate 20 MG tablet Commonly known as: ISORDIL   levothyroxine 75 MCG tablet Commonly known as: SYNTHROID   OLANZapine 10 MG tablet Commonly known as: ZYPREXA   OLANZapine 5 MG tablet Commonly known as: ZYPREXA   OVER THE COUNTER MEDICATION   pantoprazole 40 MG tablet Commonly known as: PROTONIX   traZODone 50 MG tablet Commonly known as: DESYREL     TAKE these medications   morphine 2 MG/ML injection Inject 1 mL (2 mg total) into the vein every 5 (five) minutes as needed (immediately upon extubation while awaiting infusion. Give for uncontrolled pain, distress, or if respiratory rate greater than 18.).  Allergies  Allergen Reactions  . Benzonatate   . Codeine   . Lithium    Consultations:  PCCM Transfer  Palliative Care  Neurology  Procedures/Studies: Dg Chest 1 View  Result Date: 04/18/2019 CLINICAL DATA:  Central line placement. EXAM: CHEST  1 VIEW COMPARISON:  Chest radiograph performed same day FINDINGS: Stable positioning of the endotracheal and transesophageal tubes. Left internal jugular approach central venous catheter tip approximates the superior cavoatrial junction. Stable cardiomegaly. Likely bilateral effusions with bibasilar areas of atelectasis. No convincing edema. IMPRESSION: Appropriate positioning of a left IJ approach central venous catheter. No other significant interval change from 2 hours prior Electronically Signed   By: Lovena Le M.D.   On: 04/18/2019 22:54   Mr Brain Wo  Contrast  Result Date: 04/19/2019 CLINICAL DATA:  Encephalopathy. Altered mental status and septic shock. EXAM: MRI HEAD WITHOUT CONTRAST TECHNIQUE: Multiplanar, multiecho pulse sequences of the brain and surrounding structures were obtained without intravenous contrast. COMPARISON:  None. FINDINGS: Brain: Diffusion imaging does not show any acute or subacute stroke or other cause of restricted diffusion. No brainstem or cerebellar focal insult. Cerebral hemispheres show generalized atrophy. There is old infarction in the right temporal lobe, progressed to atrophy, encephalomalacia and gliosis. Chronic small-vessel ischemic changes affect the deep white matter. No evidence of mass lesion, hemorrhage, hydrocephalus or extra-axial collection. Vascular: Major vessels at the base of the brain show flow. Skull and upper cervical spine: Negative. Some hyperostosis of the inner table in the right middle cranial fossa. Sinuses/Orbits: Clear/normal Other: None IMPRESSION: No acute finding. Advanced generalized brain atrophy. 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 Chronic small-vessel ischemic changes of the white matter. Electronically Signed   By: Nelson Chimes M.D.   On: 04/19/2019 17:47   US Renal  Result Date: 04/21/2019 CLINICAL DATA:  Acute renal failure. EXAM: RENAL / URINARY TRACT ULTRASOUND COMPLETE COMPARISON:  None. FINDINGS: Right Kidney: Renal measurements: 9.9 x 5.0 x 5.3 cm = volume: 136.4 mL. Mild renal cortical thinning. Echogenicity within normal limits. No mass or hydronephrosis visualized. Left Kidney: Renal measurements: 9.3 x 5.5 x 5.7 cm = volume: 153.8 mL. Mild renal cortical thinning. Echogenicity within normal limits. No mass or hydronephrosis visualized. Bladder: Foley catheter mostly decompresses the bladder. Sludge noted in the gallbladder. IMPRESSION: 1. No acute findings.  No hydronephrosis. 2. Mild bilateral renal cortical thinning. Mild reduction in renal sizes. Electronically Signed   By: Lajean Manes M.D.   On: 04/21/2019 15:18   Dg Chest Port 1 View  Result Date: 05/02/2019 CLINICAL DATA:  Infiltrate of lung present on imaging of chest EXAM: PORTABLE CHEST 1 VIEW COMPARISON:  05/01/2019 FINDINGS: Endotracheal tube is in place, cyst 2.6 centimeters above the carina. Nasogastric tube is in place, side port overlying the level of the proximal stomach and tip beyond the edge of the image. The heart is enlarged. There are patchy opacities at the lung bases, partially obscuring hemidiaphragms. Perihilar opacities are present, consistent with pulmonary vascular congestion/early edema. IMPRESSION: 1. Cardiomegaly and mild edema. 2. Bibasilar opacities consistent with atelectasis or infiltrates. Electronically Signed   By: Nolon Nations M.D.   On: 05/02/2019 08:04   Dg Chest Port 1 View  Result Date: 05/01/2019 CLINICAL DATA:  Intubation. EXAM: PORTABLE CHEST 1 VIEW COMPARISON:  04/30/2019. FINDINGS: Endotracheal tube tip 2.0 cm above the lower portion of the carina. NG tube tip noted over the stomach. Stable cardiomegaly. Bibasilar atelectasis again noted. Left base infiltrate cannot be excluded. No pleural effusion noted  on today's exam. No pneumothorax. IMPRESSION: 1.  Lines and tubes in stable position. 2.  Stable cardiomegaly. 3. Bibasilar atelectasis again noted. Left base infiltrate cannot be excluded. Interim clearing of pleural effusions. Electronically Signed   By: Marcello Moores  Register   On: 05/01/2019 05:58   Dg Chest Port 1 View  Result Date: 04/30/2019 CLINICAL DATA:  73 year old female with history of endotracheal tube placement. EXAM: PORTABLE CHEST 1 VIEW COMPARISON:  Chest x-ray 04/26/2019. FINDINGS: An endotracheal tube is in place with tip 2.6 cm above the carina. A nasogastric tube is seen extending into the stomach, however, the tip of the nasogastric tube extends below the lower margin of the image. Moderate right pleural effusion. Small left pleural effusion. Bibasilar opacities  which may reflect areas of atelectasis and/or consolidation. No evidence of pulmonary edema. Heart size is mildly enlarged. Upper mediastinal contours are within normal limits. Aortic atherosclerosis. IMPRESSION: 1. Support apparatus, as above. 2. Moderate right and small left pleural effusions with probable areas of bibasilar subsegmental atelectasis. 3. Mild cardiomegaly. 4. Aortic atherosclerosis. Electronically Signed   By: Vinnie Langton M.D.   On: 04/30/2019 07:23   Dg Chest Port 1 View  Result Date: 04/26/2019 CLINICAL DATA:  Respiratory failure. EXAM: PORTABLE CHEST 1 VIEW COMPARISON:  04/24/2019. FINDINGS: Endotracheal tube, NG tube in stable position. Stable cardiomegaly. Stable bibasilar atelectasis/infiltrates. Tiny right pleural effusion cannot be excluded on today's exam. No pneumothorax. Left ureteral stent noted. IMPRESSION: 1.  Endotracheal tube and NG tube stable position. 2.  Stable cardiomegaly. 3. Stable bibasilar atelectasis/infiltrates. Tiny right pleural effusion cannot be excluded on today's exam. Electronically Signed   By: Marcello Moores  Register   On: 04/26/2019 08:20   Dg Chest Port 1 View  Result Date: 04/24/2019 CLINICAL DATA:  Respiratory failure. EXAM: PORTABLE CHEST 1 VIEW COMPARISON:  04/23/2019.  04/21/2019. FINDINGS: Endotracheal tube and NG tube in stable position. Stable cardiomegaly. Bibasilar atelectasis/infiltrates. No pleural effusion or pneumothorax. IMPRESSION: 1.  Endotracheal tube and NG tube in stable position. 2. Bibasilar atelectasis/infiltrates. Similar findings noted on prior exam. Electronically Signed   By: [place]   On: 04/24/2019 07:58   Dg Chest Port 1 View  Result Date: 04/23/2019 CLINICAL DATA:  Respiratory failure. Endotracheal tube. EXAM: PORTABLE CHEST 1 VIEW COMPARISON:  Chest x-rays dated 04/21/2019 and 04/20/2019. FINDINGS: Endotracheal tube and enteric tube remain appropriately positioned. Stable cardiomegaly. Probable mild atelectasis  and/or small pleural effusion at the RIGHT lung base. Lungs otherwise clear. No pneumothorax seen. IMPRESSION: 1. Endotracheal tube and enteric tube remain appropriately positioned. 2. Stable cardiomegaly. 3. Probable mild atelectasis and/or small pleural effusion at the RIGHT lung base. Electronically Signed   By: Franki Cabot M.D.   On: 04/23/2019 07:01   Dg Chest Port 1 View  Result Date: 04/21/2019 CLINICAL DATA:  Acute respiratory failure EXAM: PORTABLE CHEST 1 VIEW COMPARISON:  04/20/2019 FINDINGS: Endotracheal tube and nasogastric catheter are again seen and stable. Cardiac shadow is prominent but stable. Left-sided jugular central line is been removed in the interval. Overall inspiratory effort is poor with mild right basilar atelectasis identified. IMPRESSION: Right basilar atelectasis. Tubes and lines as described. Electronically Signed   By: Inez Catalina M.D.   On: 04/21/2019 10:27   Dg Chest Port 1 View  Result Date: 04/20/2019 CLINICAL DATA:  Acute respiratory failure with intubation. EXAM: PORTABLE CHEST 1 VIEW COMPARISON:  04/18/2019. FINDINGS: Endotracheal tube tip in satisfactory position approximately 5 cm above the carina. LEFT jugular central venous catheter tip  at or near the cavoatrial junction, unchanged. Nasogastric tube courses below the diaphragm into the stomach. Cardiac silhouette moderately enlarged, unchanged. Thoracic aorta tortuous and atherosclerotic, unchanged. Dense airspace consolidation in both lower lobes with silhouetting of both hemidiaphragms, increased since the examination 2 days ago. Mid and UPPER lungs remain clear. Pulmonary vascularity normal. IMPRESSION: 1. Support apparatus satisfactory. 2. Worsening bilateral lower lobe atelectasis and/or pneumonia. 3. Stable cardiomegaly without pulmonary edema. Electronically Signed   By: Evangeline Dakin M.D.   On: 04/20/2019 08:17   Dg Chest Port 1 View  Result Date: 04/18/2019 CLINICAL DATA:  ET tube, OG tube  placement EXAM: PORTABLE CHEST 1 VIEW COMPARISON:  None FINDINGS: Endotracheal to is 3.5 cm above the carina. NG tube enters the stomach. There is cardiomegaly. Probable layering effusions with bibasilar atelectasis. No overt edema. IMPRESSION: Cardiomegaly, layering effusions and bibasilar atelectasis. Electronically Signed   By: Rolm Baptise M.D.   On: 04/18/2019 21:32   Significant Hospital Events   7/14 - accept transfer &pt admitted to [facility]. Poor mental status, hypotensive (s/p 3.5L cystalloide, albumin x 1, pRBC x 1). Off of all sedation  7/15 - improving BP, poor mental status. EEG w/ generalized slowing. 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 S/p 1 pRBC (6.5>8.5). Echocardiogram 7/16 -trial on PSV/CPAP, period of apnea followed by patient initiated breaths (RR 11 to 13/min) and appropriate volumes. Slight improvement in AMS 7/22-slow to improve mental status.  7/24-Weaning Vent, no improvement in mental status 7/28: transitioned to comfort care only  Consults:  Neurology-singed off  Procedures:  04/18/19- intubated at [facility] [place]) 04/18/19- Central Venous Catheter  Significant Diagnostic Tests:  7/15 MRI Brain>no acute finding. Advanced generalized atrophy. 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 Chronic small vessel ischemic changes  7/15 EEG>abnormal EEG with overall slowing, slowing is more prominent over R hemisphere (most notably R temporal region)  7/15 ECHO> Low-normal systolic function, LVEF 09-73%. LV diastolic parameters suggest impaired relaxation. Increased LV end diastolic pressure.CXR>7/15 ET tube and central line stable. Cardiomegaly with small effusion.  7/17 US renal >>no hydronephrosis 7/24 CXR: atelectasis with possible L basilar infiltrate   Micro Data:  03/16/19  Urine cx from 6/11 that on 6/13 resulted as Ecoli/ESBL (verified by Pharmacy) 7/14/20Blood cx >> ESBL e coli MRSA positive   7/14 MC -blood culture x2 >>NEG  7/15 UCx >E Coli -VRE    Subjective: Patient is unresponsive and appears comfortable on a morphine drip.  Still having short shallow breaths.  Family agreeable to residential hospice referral is been made and will be discharged in his bed is available.  Discharge Exam: Vitals:   05/06/19 0545 05/06/19 1036  BP: 101/61 (!) 111/56  Pulse: 77 75  Resp: 16 12  Temp:    SpO2: (!) 87% 93%   Vitals:   05/03/19 1310 05/05/19 0525 05/06/19 0545 05/06/19 1036  BP: 99/60 118/67 101/61 (!) 111/56  Pulse: 92 87 77 75  Resp: 12 14 16 12   Temp: 97.8 F (36.6 C)     TempSrc: Axillary     SpO2: (!) 83% 96% (!) 87% 93%  Weight:      Height:       General: Pt is unresponsive and not alert but does not appear to be in any acute distress and appears comfortable Cardiovascular: RRR, S1/S2 +, no rubs, no gallops Respiratory: Diminished bilaterally, no wheezing, no rhonchi but is having short shallow breathing Abdominal: Soft, NT, distended, bowel sounds + Extremities: no lower  extremity edema noted  The results of significant diagnostics from this hospitalization (including imaging, microbiology, ancillary and laboratory) are listed below for reference.    Microbiology: No results found for this or any previous visit (from the past 240 hour(s)).   Labs: BNP (last 3 results) Recent Labs    04/26/19 0700  BNP 355.9*   Basic Metabolic Panel: Recent Labs  Lab 04/30/19 0328 04/30/19 0452 05/01/19 0329 05/01/19 0641  NA 140 142 138 137  K 4.6 4.9 4.4 5.1  CL  --  103  --  101  CO2  --  24  --  22  GLUCOSE  --  179*  --  175*  BUN  --  90*  --  96*  CREATININE  --  2.15*  --  2.25*  CALCIUM  --  8.7*  --  8.5*  MG  --  1.6*  --  2.0  PHOS  --  5.6*  --  5.8*   Liver Function Tests: Recent Labs  Lab 04/30/19 0452 05/01/19 0641  ALBUMIN 1.4* 1.4*   No results for input(s): LIPASE, AMYLASE in the last 168 hours. Recent Labs  Lab  05/02/19 0449  AMMONIA 21   CBC: Recent Labs  Lab 04/30/19 0328 04/30/19 0452 05/01/19 0329 05/01/19 0641  WBC  --  16.6*  --  18.1*  HGB 7.1* 7.4* 7.8* 7.4*  HCT 21.0* 23.2* 23.0* 23.4*  MCV  --  90.3  --  91.4  PLT  --  304  --  398   Cardiac Enzymes: No results for input(s): CKTOTAL, CKMB, CKMBINDEX, TROPONINI in the last 168 hours. BNP: Invalid input(s): POCBNP CBG: Recent Labs  Lab 05/01/19 0543 05/01/19 1133 05/01/19 1730 05/01/19 2359 05/02/19 1154  GLUCAP 171* 206* 176* 161* 155*   D-Dimer No results for input(s): DDIMER in the last 72 hours. Hgb A1c No results for input(s): HGBA1C in the last 72 hours. Lipid Profile No results for input(s): CHOL, HDL, LDLCALC, TRIG, CHOLHDL, LDLDIRECT in the last 72 hours. Thyroid function studies No results for input(s): TSH, T4TOTAL, T3FREE, THYROIDAB in the last 72 hours.  Invalid input(s): FREET3 Anemia work up No results for input(s): VITAMINB12, FOLATE, FERRITIN, TIBC, IRON, RETICCTPCT in the last 72 hours. Urinalysis No results found for: COLORURINE, APPEARANCEUR, LABSPEC, [place], GLUCOSEU, HGBUR, BILIRUBINUR, KETONESUR, PROTEINUR, UROBILINOGEN, NITRITE, LEUKOCYTESUR Sepsis Labs Invalid input(s): PROCALCITONIN,  WBC,  LACTICIDVEN Microbiology No results found for this or any previous visit (from the past 240 hour(s)).  Time coordinating discharge: 25 minutes  SIGNED:  Kerney Elbe, DO [facility] 05/06/2019, 11:22 AM Pager is on [place]  If 7PM-7AM, please contact night-coverage [email or web address] Password [email or web address]

## 2019-05-06 DIAGNOSIS — N179 Acute kidney failure, unspecified: Secondary | ICD-10-CM

## 2019-05-06 DIAGNOSIS — N170 Acute kidney failure with tubular necrosis: Secondary | ICD-10-CM

## 2019-05-06 DIAGNOSIS — R918 Other nonspecific abnormal finding of lung field: Secondary | ICD-10-CM

## 2019-05-06 MED ORDER — MORPHINE SULFATE (PF) 2 MG/ML IV SOLN: 2.0000 mg | INTRAVENOUS | 0 refills | Status: AC | PRN

## 2019-05-06 MED ORDER — HYDROMORPHONE BOLUS VIA INFUSION
0.5000 mg | INTRAVENOUS | Status: DC | PRN
  Filled 2019-05-06: qty 1

## 2019-05-06 MED ORDER — SODIUM CHLORIDE 0.9 % IV SOLN
2.0000 mg/h | INTRAVENOUS | Status: DC
  Administered 2019-05-06: 12:00:00 2 mg/h via INTRAVENOUS
  Filled 2019-05-06: qty 2.5

## 2019-05-06 NOTE — Progress Notes (Signed)
17 Pt's daughter called and asked for updates regarding pts condition.

## 2019-05-06 NOTE — Progress Notes (Signed)
29mL of Morphine wasted with Leatrice Jewels, RN

## 2019-05-06 NOTE — Social Work (Signed)
Clinical Social Worker facilitated patient discharge including contacting patient family and facility to confirm patient discharge plans.  Clinical information faxed to facility and family agreeable with plan.  CSW arranged ambulance transport via [facility] to [facility]; [facility] called for 3:30pm. RN to call [phone]  with report for Robin prior to discharge.  Should other needs arise please call [phone]  Clinical Social Worker will sign off for now as social work intervention is no longer needed. Please consult [place] again if new need arises.  Westley Hummer, MSW, [place] Social Worker [phone]

## 2019-05-06 NOTE — Progress Notes (Signed)
Daily Progress Note   Patient Name: Nancy Mullins       Date: 05/06/2019 DOB: 08/12/1946  Age: 73 y.o. MRN#: [id number] Attending Physician: Kerney Elbe, DO Primary Care Physician: No primary care provider on file. Admit Date: 04/18/2019  Reason for Consultation/Follow-up: Hospice Evaluation  Subjective: Called by CSW to determine if patient is stable for transport to [facility].  Patient does not wake.  She grimaces particularly when I gently press on her abdomen.  VSS.  Breathing is shallow but regular.  Extremities are warm.  I spoke with the RN staff.  She seems similar today to yesterday.  Discussed with Daughter on the phone.  Also left a message for Son [facility]).  Explained that her window is likely hours to days and at this point we are uncertain of when it will be.  My recommendation is to transport her to [facility] so the family may have easier access.   I gave my number and told them to call with further questions.  [place] expressed appreciation.   Assessment: Patient actively dying, but hemodynamically stable.  She appears in some pain.   Patient Profile/HPI:  73 y.o. female  with past medical history of dementia, HTN, HLD, CHF, CKD stage 3, diabetes with neuropathy, PVD admitted on 04/18/2019 with from outside hospital with septic shock and intubated. Sepsis improved but she was unable to successfully wean from ventilator. Family have opted for comfort care over trach/PEG.   Length of Stay: 18  Current Medications: Scheduled Meds:  . Chlorhexidine Gluconate Cloth  6 each Topical Q0600  . glycopyrrolate  0.4 mg Intravenous TID    Continuous Infusions: . sodium chloride Stopped (04/19/19 0436)  . HYDROmorphone      PRN Meds: acetaminophen **OR**  acetaminophen, diphenhydrAMINE, HYDROmorphone, LORazepam, morphine injection, polyvinyl alcohol  Physical Exam        Well developed female, obese, does not wake.  Actively dying. CV irreg Breathing shallow but regular Abdomen obese, tender to light palpation.   Foley and rectoseal in place Extremities warm.  Vital Signs: BP (!) 111/56 (BP Location: Right Arm)   Pulse 75   Temp 97.8 F (36.6 C) (Axillary)   Resp 12   Ht 5\' 3"  (1.6 m)   Wt 86.8 kg   SpO2 93%   BMI 33.90 kg/m  SpO2: SpO2: 93 % O2 Device: O2 Device: Room Air O2 Flow Rate:    Intake/output summary:   Intake/Output Summary (Last 24 hours) at 05/06/2019 1056 Last data filed at 05/06/2019 0847 Gross per 24 hour  Intake 0 ml  Output 100 ml  Net -100 ml   LBM: Last BM Date: (UTA; flexiseal) Baseline Weight: Weight: 80 kg Most recent weight: Weight: 86.8 kg       Palliative Assessment/Data: 10%      Patient Active Problem List   Diagnosis Date Noted  . Palliative care encounter   . Acute respiratory failure with hypoxia ([place]) 04/18/2019  . Encounter for intubation   . Septic shock ([place])   . Encephalopathy acute   . Acute respiratory insufficiency   . Endotracheally intubated   . Central venous catheter in place     Palliative Care Plan    Recommendations/Plan:  Rotate opioid from morphine to dilaudid as patient is in pain and has renal failure.  Recommend transfer to [place] as soon as a bed is available.  Goals of Care and Additional Recommendations:  Limitations on Scope of Treatment: Full Comfort Care  Code Status:  DNR  Prognosis:   Hours - Days   Discharge Planning:  Hospice facility  Care plan was discussed with RN, family.  Thank you for allowing the Palliative Medicine Team to assist in the care of this patient.  Total time spent:  25 min.     Greater than 50%  of this time was spent counseling and coordinating care related to the above assessment and  plan.  Florentina Jenny, PA-C Palliative Medicine  Please contact Palliative MedicineTeam phone at [phone] for questions and concerns between 7 am - 7 pm.   Please see AMION for individual provider pager numbers.

## 2019-05-06 NOTE — Progress Notes (Signed)
Report called to Gara Kroner at [facility].

## 2019-05-06 NOTE — Social Work (Signed)
CSW f/u with [facility] through [place].  Received call back from Mariann Laster who directed CSW to call Hospice Home directly at [phone]. Spoke with Jackelyn Poling and she is concerned regarding stability for transport. CSW has reached out to bedside RN, MD and PMT NP for update as to appropriateness for transfer at this time. Should she be appropriate for transfer then pt will need for bedside RN to call hospice home for current comfort regimen.   Westley Hummer, MSW, [place] Work [phone]

## 2019-05-06 NOTE — Progress Notes (Signed)
50mL of Dilaudid wasted with Suzan Garibaldi, RN.

## 2019-06-06 DEATH — deceased

## 2021-03-24 IMAGING — US US RENAL
1 series · 14 of 25 positions shown · non-contrast
Comparison: None.

CLINICAL DATA: Acute renal failure.

EXAM:
RENAL / URINARY TRACT ULTRASOUND COMPLETE

[Series 1: us renal · 14 of 32 slices shown]
[im 1/32]
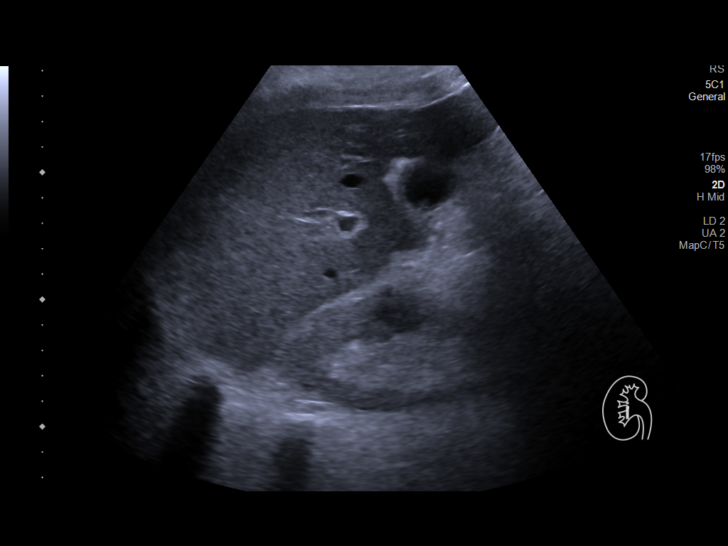
[im 3/32]
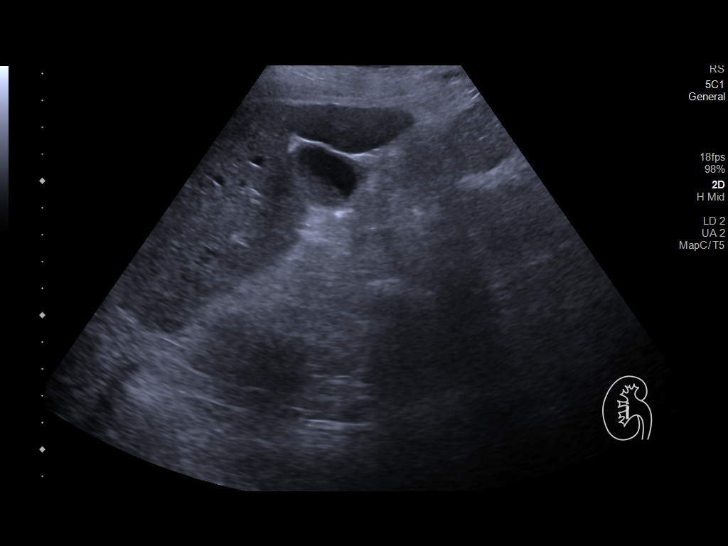
[im 6/32]
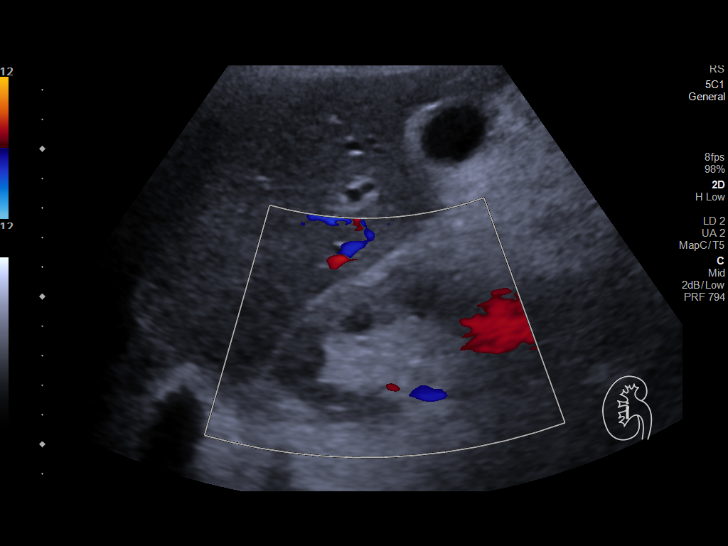
[im 8/32]
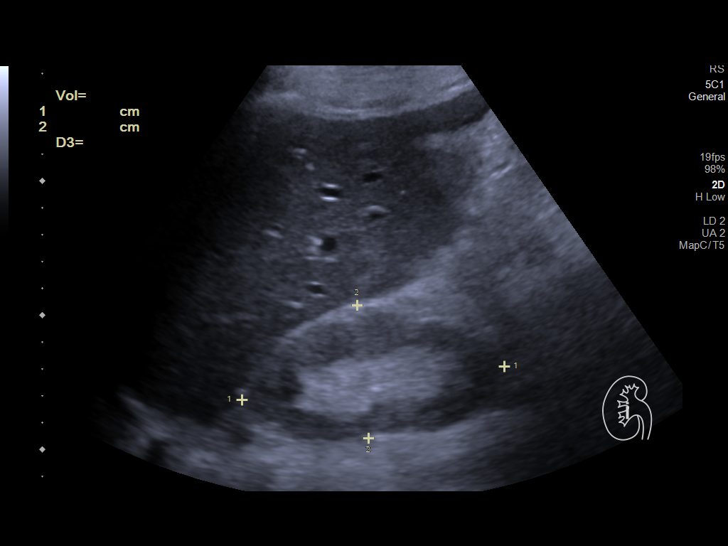
[im 11/32]
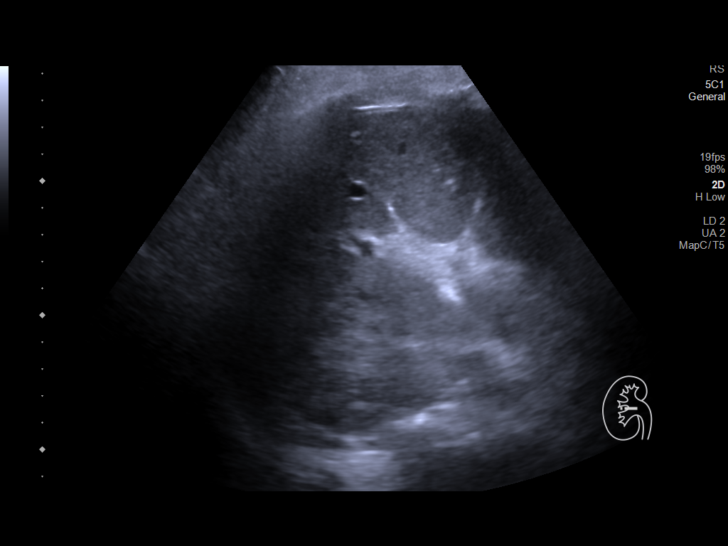
[im 12/32]
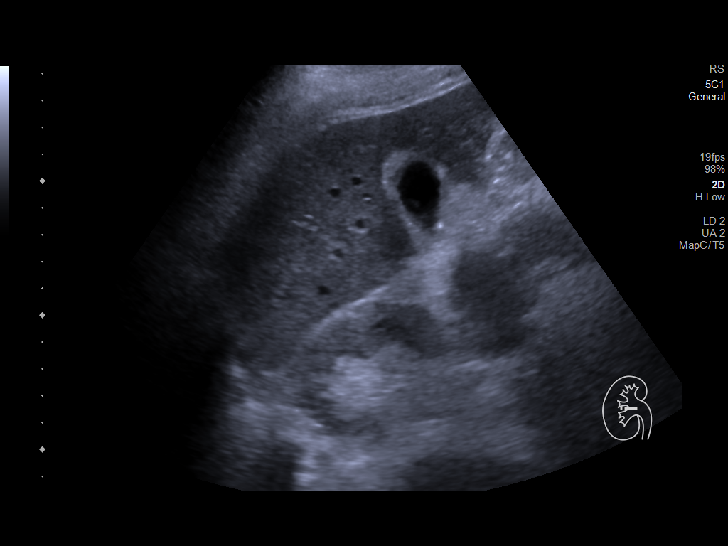
[im 15/32]
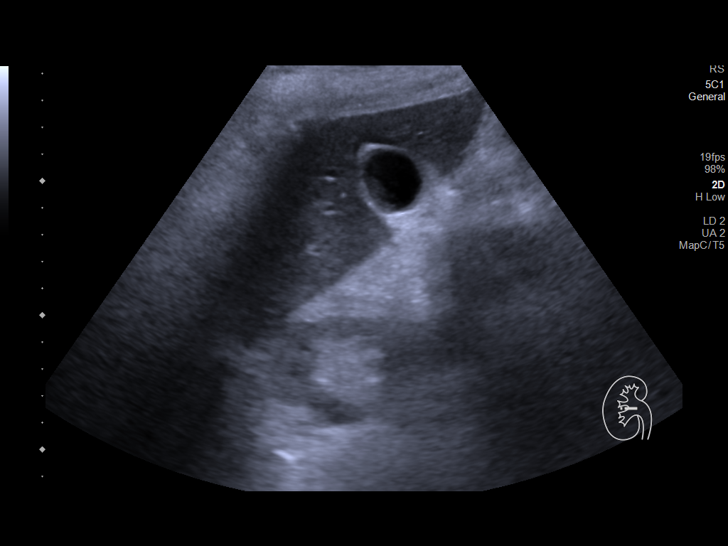
[im 17/32]
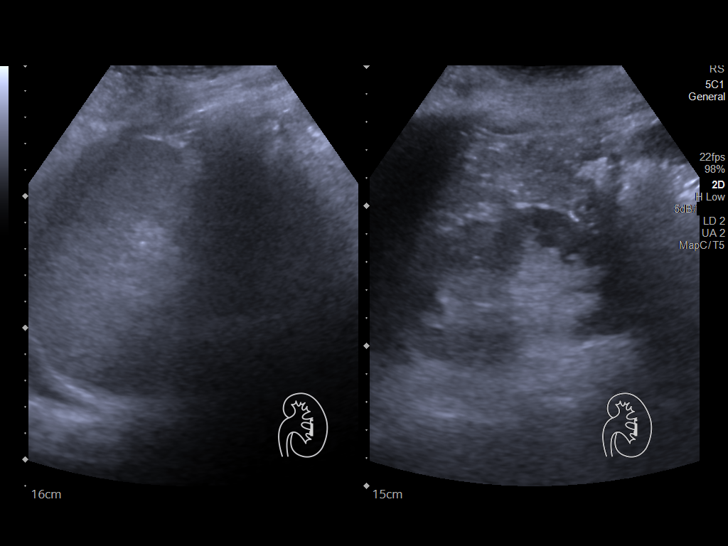
[im 20/32]
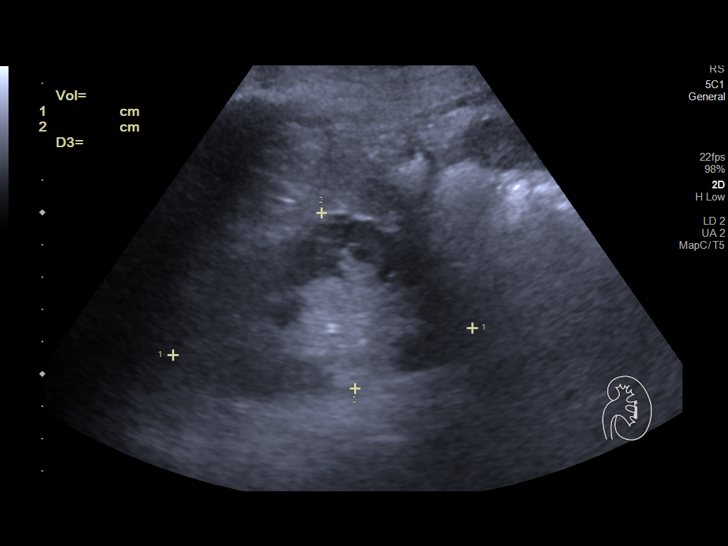
[im 21/32]
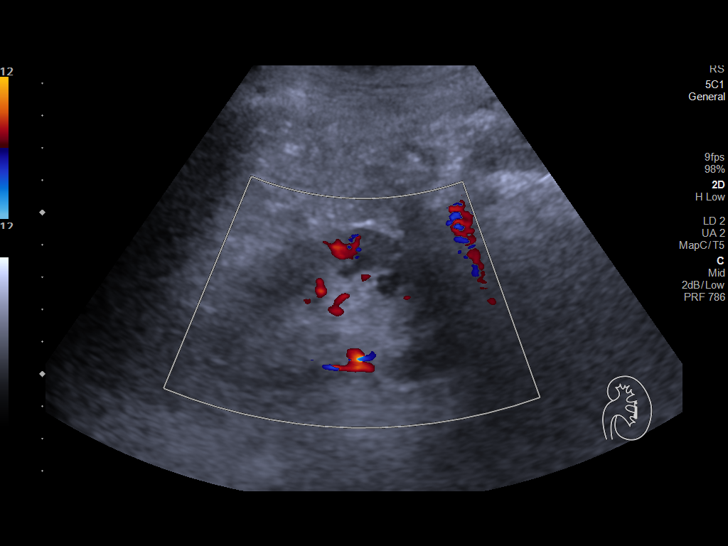
[im 24/32]
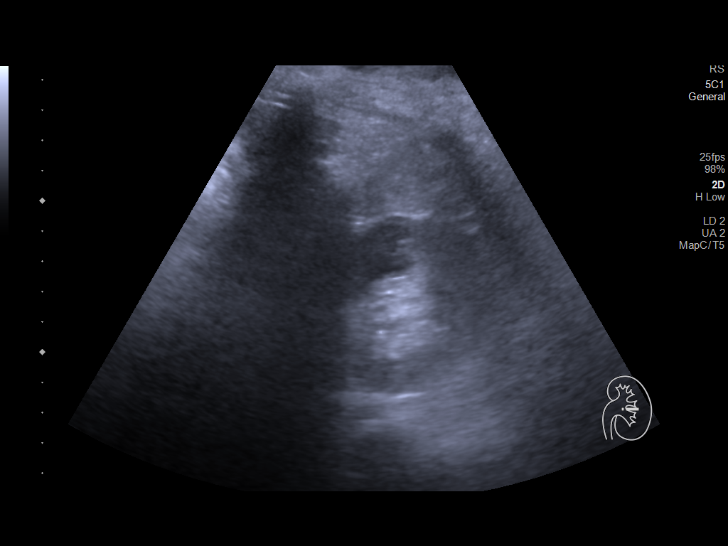
[im 26/32]
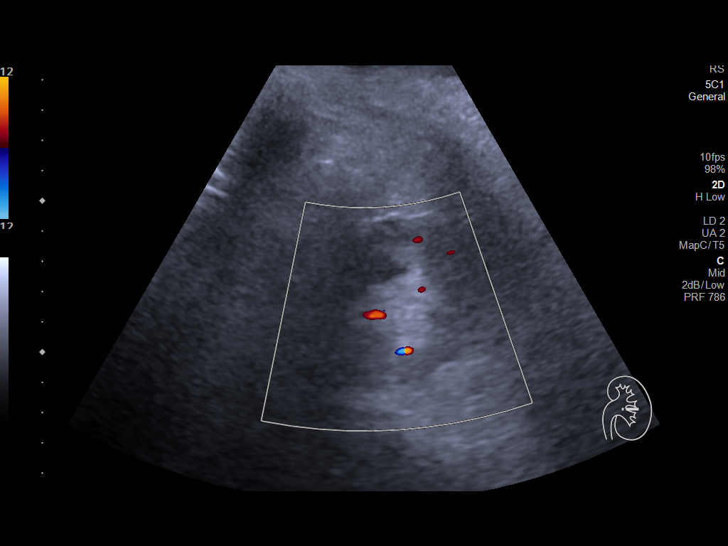
[im 29/32]
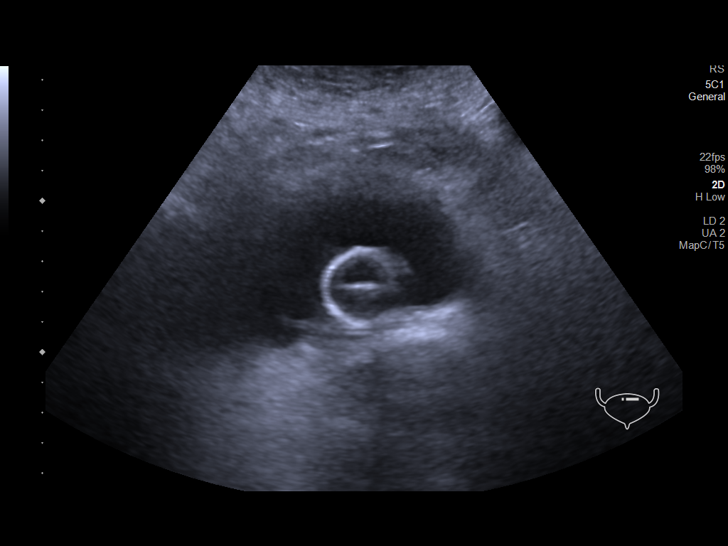
[im 32/32]
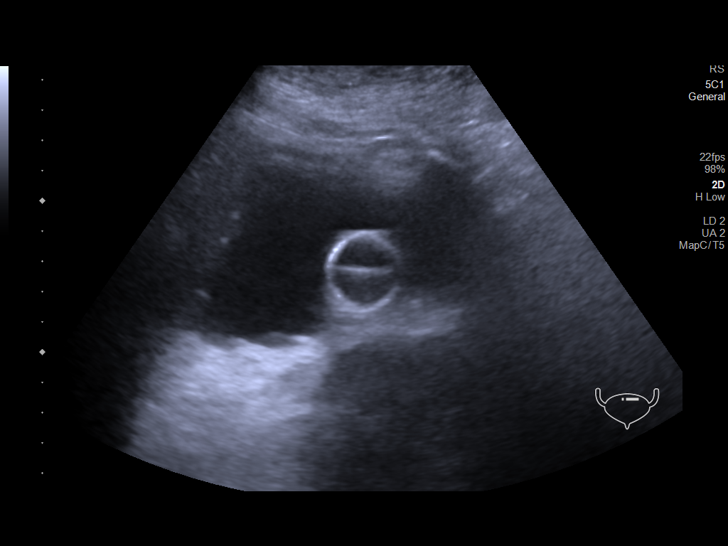

[14 of 25 positions shown; findings below may reference images not displayed]

FINDINGS: Right Kidney:

Renal measurements: 9.9 x 5.0 x 5.3 cm = volume: 136.4 mL. Mild
renal cortical thinning. Echogenicity within normal limits. No mass
or hydronephrosis visualized.

Left Kidney:

Renal measurements: 9.3 x 5.5 x 5.7 cm = volume: 153.8 mL. Mild
renal cortical thinning. Echogenicity within normal limits. No mass
or hydronephrosis visualized.

Bladder:

Foley catheter mostly decompresses the bladder.

Sludge noted in the gallbladder.
IMPRESSION: 1. No acute findings.  No hydronephrosis.
2. Mild bilateral renal cortical thinning. Mild reduction in renal
sizes.

## 2021-03-27 IMAGING — DX PORTABLE CHEST - 1 VIEW
1 series · 1 of 1 positions shown · non-contrast
Comparison: 04/23/2019.  04/21/2019.

CLINICAL DATA: Respiratory failure.

EXAM:
PORTABLE CHEST 1 VIEW

[chest ap]
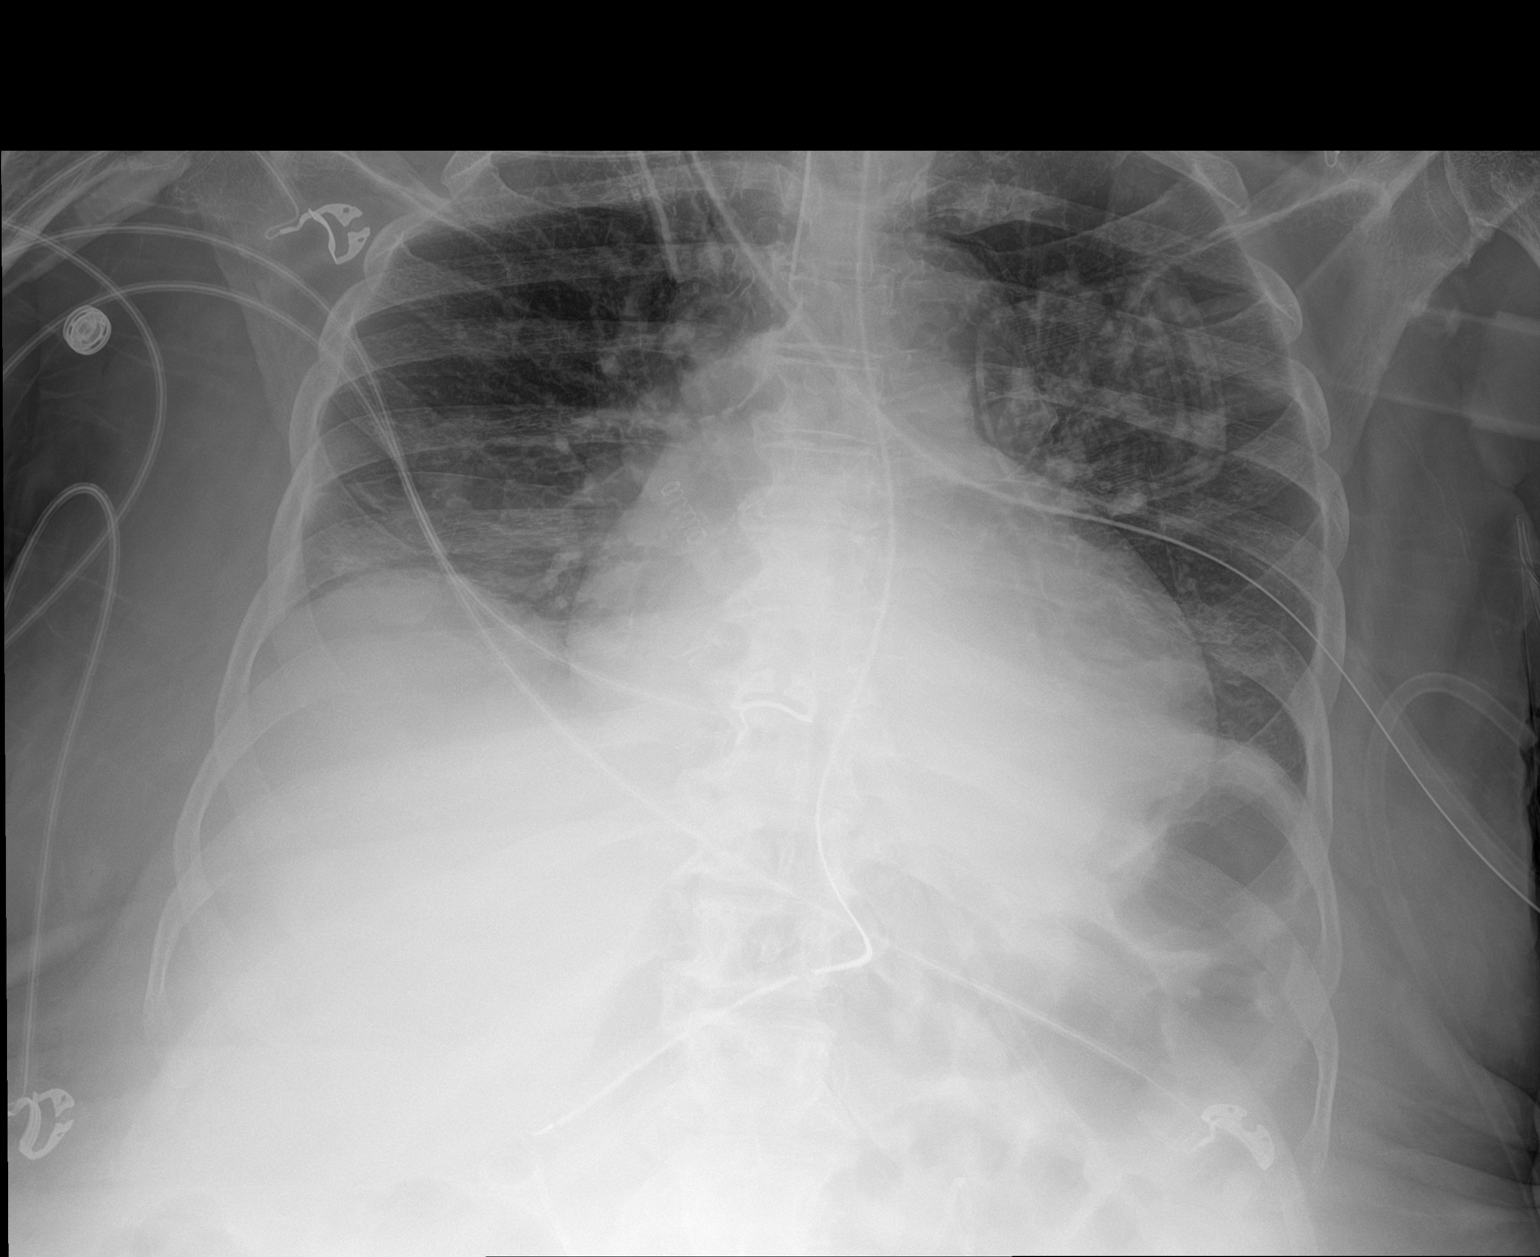

[1 of 1 positions shown; findings below may reference images not displayed]

FINDINGS: Endotracheal tube and NG tube in stable position. Stable
cardiomegaly. Bibasilar atelectasis/infiltrates. No pleural effusion
or pneumothorax.
IMPRESSION: 1.  Endotracheal tube and NG tube in stable position.

2. Bibasilar atelectasis/infiltrates. Similar findings noted on
prior exam.

## 2021-04-03 IMAGING — DX PORTABLE CHEST - 1 VIEW
1 series · 1 of 1 positions shown · non-contrast
Comparison: 04/30/2019.

CLINICAL DATA: Intubation.

EXAM:
PORTABLE CHEST 1 VIEW

[chest ap]
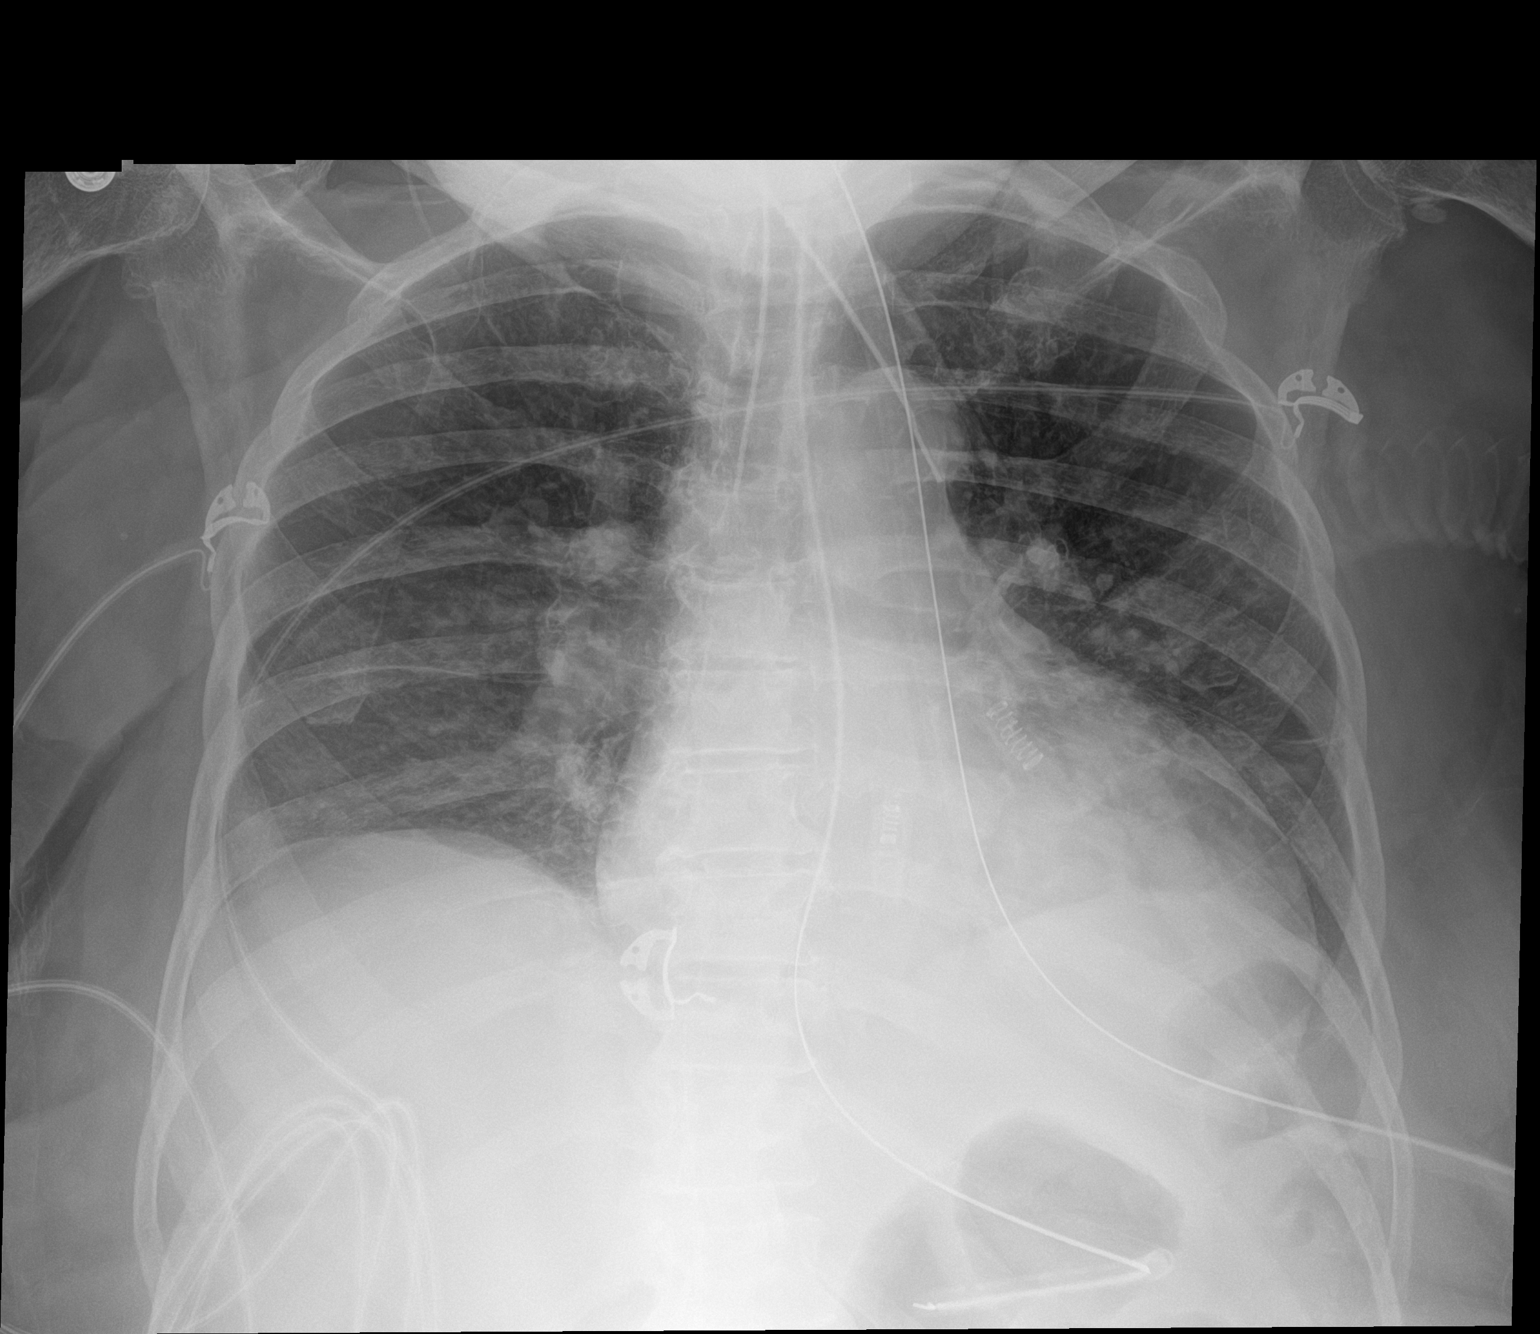

[1 of 1 positions shown; findings below may reference images not displayed]

FINDINGS: Endotracheal tube tip 2.0 cm above the lower portion of the carina.
NG tube tip noted over the stomach. Stable cardiomegaly. Bibasilar
atelectasis again noted. Left base infiltrate cannot be excluded. No
pleural effusion noted on today's exam. No pneumothorax.
IMPRESSION: 1.  Lines and tubes in stable position.

2.  Stable cardiomegaly.

3. Bibasilar atelectasis again noted. Left base infiltrate cannot be
excluded. Interim clearing of pleural effusions.
